# Patient Record
Sex: Male | Born: 1975 | Race: White | Hispanic: No | Marital: Married | Smoking: Never smoker
Health system: Southern US, Community
[De-identification: ages and names within clinical notes are randomized; demographics above are authoritative.]

## PROBLEM LIST (undated history)

## (undated) DIAGNOSIS — Z8669 Personal history of other diseases of the nervous system and sense organs: Secondary | ICD-10-CM

## (undated) HISTORY — DX: Personal history of other diseases of the nervous system and sense organs: Z86.69

## (undated) HISTORY — PX: OTHER SURGICAL HISTORY: SHX169

## (undated) HISTORY — PX: BACK SURGERY: SHX140

## (undated) HISTORY — PX: TYMPANOSTOMY TUBE PLACEMENT: SHX32

---

## 2014-07-04 ENCOUNTER — Encounter: Payer: Self-pay | Admitting: Family Medicine

## 2014-07-04 ENCOUNTER — Ambulatory Visit (INDEPENDENT_AMBULATORY_CARE_PROVIDER_SITE_OTHER): Payer: Federal, State, Local not specified - PPO | Admitting: Family Medicine

## 2014-07-04 VITALS — BP 131/85 | HR 62 | Temp 98.3°F | Ht 67.0 in | Wt 162.0 lb

## 2014-07-04 DIAGNOSIS — Z8669 Personal history of other diseases of the nervous system and sense organs: Secondary | ICD-10-CM

## 2014-07-04 DIAGNOSIS — M7661 Achilles tendinitis, right leg: Secondary | ICD-10-CM

## 2014-07-04 DIAGNOSIS — Z0000 Encounter for general adult medical examination without abnormal findings: Secondary | ICD-10-CM | POA: Diagnosis not present

## 2014-07-04 DIAGNOSIS — Z8042 Family history of malignant neoplasm of prostate: Secondary | ICD-10-CM

## 2014-07-04 DIAGNOSIS — Z8619 Personal history of other infectious and parasitic diseases: Secondary | ICD-10-CM

## 2014-07-04 DIAGNOSIS — M722 Plantar fascial fibromatosis: Secondary | ICD-10-CM | POA: Insufficient documentation

## 2014-07-04 DIAGNOSIS — Z9622 Myringotomy tube(s) status: Secondary | ICD-10-CM | POA: Insufficient documentation

## 2014-07-04 HISTORY — DX: Personal history of other diseases of the nervous system and sense organs: Z86.69

## 2014-07-04 NOTE — Progress Notes (Signed)
CC: Matthew Marks is a 39 y.o. male is here for Establish Care   Subjective: HPI:  Colonoscopy: No current indication begin screening at age 39 Prostate: Discussed screening risks/beneifts with patient today, father had prostate cancer at age 41 we'll begin screening at age 39  Influenza Vaccine: No current indication Pneumovax: No current indication Td/Tdap: Up-to-date Zoster: (Start 41 yo)  Very present 39 year old here to establish care  His only complaint is a long-standing right heel pain that has been present for at least a year now. Interventions have included x-rays, physical therapy, chiropractic sessions in a topical anti-inflammatory. Despite all this pain is persistent, moderate in severity nonradiating localized on the back of the heel and painful enough to where he has pretty much stopped playing soccer and running which he used to do on a daily basis.   Review of Systems - General ROS: negative for - chills, fever, night sweats, weight gain or weight loss Ophthalmic ROS: negative for - decreased vision Psychological ROS: negative for - anxiety or depression ENT ROS: negative for - hearing change, nasal congestion, tinnitus or allergies Hematological and Lymphatic ROS: negative for - bleeding problems, bruising or swollen lymph nodes Breast ROS: negative Respiratory ROS: no cough, shortness of breath, or wheezing Cardiovascular ROS: no chest pain or dyspnea on exertion Gastrointestinal ROS: no abdominal pain, change in bowel habits, or black or bloody stools Genito-Urinary ROS: negative for - genital discharge, genital ulcers, incontinence or abnormal bleeding from genitals Musculoskeletal ROS: negative for - joint pain or muscle pain other than that described above Neurological ROS: negative for - headaches or memory loss Dermatological ROS: negative for lumps, mole changes, rash and skin lesion changes  History reviewed. No pertinent past medical history.  Past  Surgical History  Procedure Laterality Date  . Tympanostomy tube placement      as a child and as an adult  . Left knee surgery ligament tension     Family History  Problem Relation Age of Onset  . Prostate cancer Father   . Diabetes Mother   . Hypertension Father     History   Social History  . Marital Status: Married    Spouse Name: N/A  . Number of Children: N/A  . Years of Education: N/A   Occupational History  . Not on file.   Social History Main Topics  . Smoking status: Current Every Day Smoker    Types: Cigars  . Smokeless tobacco: Not on file  . Alcohol Use: 7.2 oz/week    12 Standard drinks or equivalent per week  . Drug Use: No  . Sexual Activity:    Partners: Female   Other Topics Concern  . Not on file   Social History Narrative  . No narrative on file     Objective: BP 131/85 mmHg  Pulse 62  Temp(Src) 98.3 F (36.8 C) (Oral)  Ht 5\' 7"  (1.702 m)  Wt 162 lb (73.483 kg)  BMI 25.37 kg/m2  General: No Acute Distress HEENT: Atraumatic, normocephalic, conjunctivae normal without scleral icterus.  No nasal discharge, hearing grossly intact, TMs with good landmarks bilaterally with no middle ear abnormalities, tympanostomy tube in the right ear, posterior pharynx clear without oral lesions. Neck: Supple, trachea midline, no cervical nor supraclavicular adenopathy. Pulmonary: Clear to auscultation bilaterally without wheezing, rhonchi, nor rales. Cardiac: Regular rate and rhythm.  No murmurs, rubs, nor gallops. No peripheral edema.  2+ peripheral pulses bilaterally. Abdomen: Bowel sounds normal.  No masses.  Non-tender without  rebound.  Negative Murphy's sign. MSK: Grossly intact, no signs of weakness.  Full strength throughout upper and lower extremities.  Full ROM in upper and lower extremities.  No midline spinal tenderness. Neuro: Gait unremarkable, CN II-XII grossly intact.  C5-C6 Reflex 2/4 Bilaterally, L4 Reflex 2/4 Bilaterally.  Cerebellar function  intact. Skin: No rashes. Noninflamed seborrheic keratosis 2 on the back, to noninflamed less than half centimeter sebaceous cyst on the right upper shoulder Psych: Alert and oriented to person/place/time.  Thought process normal. No anxiety/depression.  Assessment & Plan: [name] was seen today for establish care.  Diagnoses and all orders for this visit:  Annual physical exam Orders: -     Lipid panel -     COMPLETE METABOLIC PANEL WITH GFR -     CBC  H/O chronic ear infection  Family history of prostate cancer  Right Achilles tendinitis   Healthy lifestyle interventions including but not limited to regular exercise, a healthy low fat diet, moderation of salt intake, the dangers of tobacco/alcohol/recreational drug use, nutrition supplementation, and accident avoidance were discussed with the patient and a handout was provided for future reference.  Offered excision of sebaceous cyst that his convenience but that these are benign and do not need to have any intervention done on them unless they're painful or infected.  I referred him to Dr. Karie Schwalbe in our sports medicine clinic for further management of his Achilles tendinitis.  At the end of our visit he tells me that his right ear feels somewhat full lately, encouraged him to use leftover ofloxacin and like ear drops and follow-up with his ear nose and throat physician if he does not have enough drops for 7 days at a frequency of twice a day call me and I will call in a prescription  Return in about 1 year (around 07/04/2015).

## 2014-07-04 NOTE — Patient Instructions (Signed)
Dr. Daphanie Oquendo's General Advice Following Your Complete Physical Exam  The Benefits of Regular Exercise: Unless you suffer from an uncontrolled cardiovascular condition, studies strongly suggest that regular exercise and physical activity will add to both the quality and length of your life.  The World Health Organization recommends 150 minutes of moderate intensity aerobic activity every week.  This is best split over 3-4 days a week, and can be as simple as a brisk walk for just over 35 minutes "most days of the week".  This type of exercise has been shown to lower LDL-Cholesterol, lower average blood sugars, lower blood pressure, lower cardiovascular disease risk, improve memory, and increase one's overall sense of wellbeing.  The addition of anaerobic (or "strength training") exercises offers additional benefits including but not limited to increased metabolism, prevention of osteoporosis, and improved overall cholesterol levels.  How Can I Strive For A Low-Fat Diet?: Current guidelines recommend that 25-35 percent of your daily energy (food) intake should come from fats.  One might ask how can this be achieved without having to dissect each meal on a daily basis?  Switch to skim or 1% milk instead of whole milk.  Focus on lean meats such as ground turkey, fresh fish, baked chicken, and lean cuts of beef as your source of dietary protein.  Limit saturated fat consumption to less than 10% of your daily caloric intake.  Limit trans fatty acid consumption primarily by limiting synthetic trans fats such as partially hydrogenated oils (Ex: fried fast foods).  Substitute olive or vegetable oil for solid fats where possible.  Moderation of Salt Intake: Provided you don't carry a diagnosis of congestive heart failure nor renal failure, I recommend a daily allowance of no more than 2300 mg of salt (sodium).  Keeping under this daily goal is associated with a decreased risk of cardiovascular events, creeping  above it can lead to elevated blood pressures and increases your risk of cardiovascular events.  Milligrams (mg) of salt is listed on all nutrition labels, and your daily intake can add up faster than you think.  Most canned and frozen dinners can pack in over half your daily salt allowance in one meal.    Lifestyle Health Risks: Certain lifestyle choices carry specific health risks.  As you may already know, tobacco use has been associated with increasing one's risk of cardiovascular disease, pulmonary disease, numerous cancers, among many other issues.  What you may not know is that there are medications and nicotine replacement strategies that can more than double your chances of successfully quitting.  I would be thrilled to help manage your quitting strategy if you currently use tobacco products.  When it comes to alcohol use, I've yet to find an "ideal" daily allowance.  Provided an individual does not have a medical condition that is exacerbated by alcohol consumption, general guidelines determine "safe drinking" as no more than two standard drinks for a man or no more than one standard drink for a male per day.  However, much debate still exists on whether any amount of alcohol consumption is technically "safe".  My general advice, keep alcohol consumption to a minimum for general health promotion.  If you or others believe that alcohol, tobacco, or recreational drug use is interfering with your life, I would be happy to provide confidential counseling regarding treatment options.  General "Over The Counter" Nutrition Advice: Postmenopausal women should aim for a daily calcium intake of 1200 mg, however a significant portion of this might already be   provided by diets including milk, yogurt, cheese, and other dairy products.  Vitamin D has been shown to help preserve bone density, prevent fatigue, and has even been shown to help reduce falls in the elderly.  Ensuring a daily intake of 800 Units of  Vitamin D is a good place to start to enjoy the above benefits, we can easily check your Vitamin D level to see if you'd potentially benefit from supplementation beyond 800 Units a day.  Folic Acid intake should be of particular concern to women of childbearing age.  Daily consumption of 400-800 mcg of Folic Acid is recommended to minimize the chance of spinal cord defects in a fetus should pregnancy occur.    For many adults, accidents still remain one of the most common culprits when it comes to cause of death.  Some of the simplest but most effective preventitive habits you can adopt include regular seatbelt use, proper helmet use, securing firearms, and regularly testing your smoke and carbon monoxide detectors.  Christianjames Soule B. Terez Montee DO [facility] [address], [address] [place], [place] [address] Phone: [phone]  

## 2014-07-10 ENCOUNTER — Encounter: Payer: Self-pay | Admitting: Family Medicine

## 2014-07-10 DIAGNOSIS — S33100A Subluxation of unspecified lumbar vertebra, initial encounter: Secondary | ICD-10-CM | POA: Insufficient documentation

## 2014-07-10 DIAGNOSIS — M5136 Other intervertebral disc degeneration, lumbar region: Secondary | ICD-10-CM | POA: Insufficient documentation

## 2014-07-10 LAB — CBC
HCT: 46.3 % (ref 39.0–52.0)
Hemoglobin: 15.8 g/dL (ref 13.0–17.0)
MCH: 30.5 pg (ref 26.0–34.0)
MCHC: 34.1 g/dL (ref 30.0–36.0)
MCV: 89.4 fL (ref 78.0–100.0)
MPV: 9.6 fL (ref 8.6–12.4)
Platelets: 271 10*3/uL (ref 150–400)
RBC: 5.18 MIL/uL (ref 4.22–5.81)
RDW: 13 % (ref 11.5–15.5)
WBC: 5 10*3/uL (ref 4.0–10.5)

## 2014-07-10 LAB — COMPLETE METABOLIC PANEL WITH GFR
ALT: 22 U/L (ref 0–53)
AST: 20 U/L (ref 0–37)
Albumin: 4.6 g/dL (ref 3.5–5.2)
Alkaline Phosphatase: 45 U/L (ref 39–117)
BILIRUBIN TOTAL: 0.7 mg/dL (ref 0.2–1.2)
BUN: 18 mg/dL (ref 6–23)
CALCIUM: 9.3 mg/dL (ref 8.4–10.5)
CHLORIDE: 102 meq/L (ref 96–112)
CO2: 27 meq/L (ref 19–32)
CREATININE: 1.01 mg/dL (ref 0.50–1.35)
GLUCOSE: 96 mg/dL (ref 70–99)
Potassium: 4.7 mEq/L (ref 3.5–5.3)
Sodium: 138 mEq/L (ref 135–145)
Total Protein: 7.4 g/dL (ref 6.0–8.3)

## 2014-07-10 LAB — LIPID PANEL
CHOLESTEROL: 183 mg/dL (ref 0–200)
HDL: 57 mg/dL (ref >=40)
LDL CALC: 112 mg/dL — AB (ref 0–99)
TRIGLYCERIDES: 68 mg/dL (ref <150)
Total CHOL/HDL Ratio: 3.2 Ratio
VLDL: 14 mg/dL (ref 0–40)

## 2014-07-24 ENCOUNTER — Ambulatory Visit (INDEPENDENT_AMBULATORY_CARE_PROVIDER_SITE_OTHER): Payer: Federal, State, Local not specified - PPO | Admitting: Sports Medicine

## 2014-07-24 ENCOUNTER — Encounter: Payer: Self-pay | Admitting: Sports Medicine

## 2014-07-24 DIAGNOSIS — M722 Plantar fascial fibromatosis: Secondary | ICD-10-CM

## 2014-07-24 MED ORDER — MELOXICAM 15 MG PO TABS: ORAL_TABLET | ORAL | Status: DC

## 2014-07-24 NOTE — Progress Notes (Signed)
   Subjective:    I'm seeing this patient as a consultation for:  Dr. Laren BoomSean Hommel  CC: Bilateral heel pain  HPI: This is a very pleasant 39 year old male, he plays soccer, coaches, for years he's had pain that he localizes on the plantar aspect of both heels, moderate, persistent, worse with the first few steps in the morning. He is barefoot through the evening at his house.  Past medical history, Surgical history, Family history not pertinant except as noted below, Social history, Allergies, and medications have been entered into the medical record, reviewed, and no changes needed.   Review of Systems: No headache, visual changes, nausea, vomiting, diarrhea, constipation, dizziness, abdominal pain, skin rash, fevers, chills, night sweats, weight loss, swollen lymph nodes, body aches, joint swelling, muscle aches, chest pain, shortness of breath, mood changes, visual or auditory hallucinations.   Objective:   General: Well Developed, well nourished, and in no acute distress.  Neuro/Psych: Alert and oriented x3, extra-ocular muscles intact, able to move all 4 extremities, sensation grossly intact. Skin: Warm and dry, no rashes noted.  Respiratory: Not using accessory muscles, speaking in full sentences, trachea midline.  Cardiovascular: Pulses palpable, no extremity edema. Abdomen: Does not appear distended. Bilateral feet: No visible erythema or swelling. Range of motion is full in all directions. Strength is 5/5 in all directions. No hallux valgus. Pes cavus. No abnormal callus noted. No pain over the navicular prominence, or base of fifth metatarsal. Minimal tenderness to palpation of the calcaneal insertion of plantar fascia. No pain at the Achilles insertion. No pain over the calcaneal bursa. No pain of the retrocalcaneal bursa. No tenderness to palpation over the tarsals, metatarsals, or phalanges. No hallux rigidus or limitus. No tenderness palpation over interphalangeal  joints. No pain with compression of the metatarsal heads. Neurovascularly intact distally.  Impression and Recommendations:   This case required medical decision making of moderate complexity.

## 2014-07-24 NOTE — Assessment & Plan Note (Signed)
With pes cavus. Gel cups, return for custom orthotics, home rehabilitation exercises and meloxicam. Advised against walking barefoot at home.

## 2014-08-08 ENCOUNTER — Ambulatory Visit (INDEPENDENT_AMBULATORY_CARE_PROVIDER_SITE_OTHER): Payer: Federal, State, Local not specified - PPO | Admitting: Sports Medicine

## 2014-08-08 ENCOUNTER — Encounter: Payer: Self-pay | Admitting: Sports Medicine

## 2014-08-08 VITALS — BP 126/79 | HR 65 | Ht 67.0 in | Wt 163.0 lb

## 2014-08-08 DIAGNOSIS — M722 Plantar fascial fibromatosis: Secondary | ICD-10-CM

## 2014-08-08 NOTE — Progress Notes (Signed)

## 2014-08-08 NOTE — Assessment & Plan Note (Signed)
Custom orthotics as above, continue rehabilitation exercises.  Return in one month.

## 2014-08-15 ENCOUNTER — Encounter: Payer: Self-pay | Admitting: Family Medicine

## 2014-08-15 ENCOUNTER — Ambulatory Visit (INDEPENDENT_AMBULATORY_CARE_PROVIDER_SITE_OTHER): Payer: Federal, State, Local not specified - PPO | Admitting: Family Medicine

## 2014-08-15 VITALS — BP 119/77 | HR 69 | Wt 162.0 lb

## 2014-08-15 DIAGNOSIS — R1310 Dysphagia, unspecified: Secondary | ICD-10-CM

## 2014-08-15 NOTE — Progress Notes (Signed)
CC: Matthew Marks is a 39 y.o. male is here for trouble swallowing   Subjective: HPI:  Irritation at the front and the bottom of his throat with swallowing for the past. Symptoms are mild in severity but after throwing up a distasteful hamburger last week symptoms have been just above mild in severity and more noticeable. Occurs with every episode of swallowing. Nothing else particularly makes it better or worse. He denies any abdominal pain, gastric reflux, neck pain, intentional weight loss, no swelling of the neck. Denies any mouth pain. No interventions as of yet. Denies swollen lymph nodes. Rare tobacco use in the past and currently no tobacco use. No heavy alcohol use. Occasional change of voice but this is only if he is talking a lot and lasts only for a few seconds.  Review Of Systems Outlined In HPI  No past medical history on file.  Past Surgical History  Procedure Laterality Date  . Tympanostomy tube placement      as a child and as an adult  . Left knee surgery ligament tension     Family History  Problem Relation Age of Onset  . Prostate cancer Father   . Diabetes Mother   . Hypertension Father     History   Social History  . Marital Status: Married    Spouse Name: N/A  . Number of Children: N/A  . Years of Education: N/A   Occupational History  . Not on file.   Social History Main Topics  . Smoking status: Never Smoker   . Smokeless tobacco: Not on file  . Alcohol Use: 7.2 oz/week    12 Standard drinks or equivalent per week  . Drug Use: No  . Sexual Activity:    Partners: Female   Other Topics Concern  . Not on file   Social History Narrative     Objective: BP 119/77 mmHg  Pulse 69  Wt 162 lb (73.483 kg)  General: Alert and Oriented, No Acute Distress HEENT: Pupils equal, round, reactive to light. Conjunctivae clear.  External ears unremarkable, canals clear with intact TMs with appropriate landmarks.  Middle ear appears open without effusion. Pink  inferior turbinates.  Moist mucous membranes, pharynx without inflammation nor lesions.  Neck supple without palpable lymphadenopathy nor abnormal masses. Lungs: clear and comfortable work of breathing Cardiac: Regular rate and rhythm.  Assessment & Plan: Matthew Marks was seen today for trouble swallowing.  Diagnoses and all orders for this visit:  Dysphagia Orders: -     Ambulatory referral to ENT   Discussed with patient I don't have a clear reason for his discomfort since that'll have full visualization below the pharynx. I'd like him to go see ear nose and throat for consideration of better visualization of his throat.  Return if symptoms worsen or fail to improve.

## 2014-09-04 ENCOUNTER — Encounter: Payer: Self-pay | Admitting: Family Medicine

## 2014-09-04 DIAGNOSIS — R198 Other specified symptoms and signs involving the digestive system and abdomen: Secondary | ICD-10-CM | POA: Insufficient documentation

## 2014-09-04 DIAGNOSIS — R0989 Other specified symptoms and signs involving the circulatory and respiratory systems: Secondary | ICD-10-CM | POA: Insufficient documentation

## 2014-09-05 ENCOUNTER — Ambulatory Visit (INDEPENDENT_AMBULATORY_CARE_PROVIDER_SITE_OTHER): Payer: Federal, State, Local not specified - PPO | Admitting: Sports Medicine

## 2014-09-05 ENCOUNTER — Encounter: Payer: Self-pay | Admitting: Sports Medicine

## 2014-09-05 VITALS — BP 112/74 | HR 72 | Ht 67.0 in | Wt 162.0 lb

## 2014-09-05 DIAGNOSIS — M722 Plantar fascial fibromatosis: Secondary | ICD-10-CM

## 2014-09-05 DIAGNOSIS — M7751 Other enthesopathy of right foot: Secondary | ICD-10-CM | POA: Diagnosis not present

## 2014-09-05 DIAGNOSIS — M778 Other enthesopathies, not elsewhere classified: Secondary | ICD-10-CM | POA: Insufficient documentation

## 2014-09-05 DIAGNOSIS — M779 Enthesopathy, unspecified: Secondary | ICD-10-CM

## 2014-09-05 NOTE — Assessment & Plan Note (Signed)
Heel lift, continue meloxicam. Rehabilitation exercises given. MRI and then retrocalcaneal bursa injection of no better.

## 2014-09-05 NOTE — Assessment & Plan Note (Signed)
Resolved with rehabilitation exercises and custom orthotics

## 2014-09-05 NOTE — Progress Notes (Signed)
  Subjective:    CC: Follow-up  HPI: Bilateral plantar fasciitis: Resolved with orthotics and rehabilitation exercises as well as meloxicam.  Right heel pain: Localize just deep to the insertion of the Achilles, worse with weightbearing. Moderate, persistent without radiation. No trauma.  Past medical history, Surgical history, Family history not pertinant except as noted below, Social history, Allergies, and medications have been entered into the medical record, reviewed, and no changes needed.   Review of Systems: No fevers, chills, night sweats, weight loss, chest pain, or shortness of breath.   Objective:    General: Well Developed, well nourished, and in no acute distress.  Neuro: Alert and oriented x3, extra-ocular muscles intact, sensation grossly intact.  HEENT: Normocephalic, atraumatic, pupils equal round reactive to light, neck supple, no masses, no lymphadenopathy, thyroid nonpalpable.  Skin: Warm and dry, no rashes. Cardiac: Regular rate and rhythm, no murmurs rubs or gallops, no lower extremity edema.  Respiratory: Clear to auscultation bilaterally. Not using accessory muscles, speaking in full sentences. Right Ankle: No visible erythema or swelling. Range of motion is full in all directions. Strength is 5/5 in all directions. Stable lateral and medial ligaments; squeeze test and kleiger test unremarkable; Talar dome nontender; No pain at base of 5th MT; No tenderness over cuboid; No tenderness over N spot or navicular prominence No tenderness on posterior aspects of lateral and medial malleolus No sign of peroneal tendon subluxations; Negative tarsal tunnel tinel's Tender to palpation just deep to the Achilles insertion over the retrocalcaneal bursa  Heel lifts placed in both orthotics.  Impression and Recommendations:

## 2014-09-17 ENCOUNTER — Ambulatory Visit (INDEPENDENT_AMBULATORY_CARE_PROVIDER_SITE_OTHER): Payer: Federal, State, Local not specified - PPO | Admitting: Family Medicine

## 2014-09-17 ENCOUNTER — Encounter: Payer: Self-pay | Admitting: Family Medicine

## 2014-09-17 VITALS — BP 115/71 | HR 63 | Temp 97.7°F | Wt 167.0 lb

## 2014-09-17 DIAGNOSIS — H6690 Otitis media, unspecified, unspecified ear: Secondary | ICD-10-CM | POA: Diagnosis not present

## 2014-09-17 MED ORDER — CEFDINIR 300 MG PO CAPS: 300.0000 mg | ORAL_CAPSULE | Freq: Two times a day (BID) | ORAL | Status: AC

## 2014-09-17 NOTE — Progress Notes (Signed)
CC: Matthew Marks is a 39 y.o. male is here for right ear feels inflammed   Subjective: HPI:  Complains of right ear fullness with muffled sounds and A yellow discharge that has been present ever since a few days after he went swimming without ear plugs on July 4. Symptoms have not improved since using otofloxacin twice a day. He denies fevers, chills, headache, nor any other motor or sensory disturbances other than that described above. The symptoms are persistent throughout the day and mild in severity. He's had this happen in the past when he went swimming without earplugs ever since he had a tympanostomy tube placed in his right ear    Review Of Systems Outlined In HPI  No past medical history on file.  Past Surgical History  Procedure Laterality Date  . Tympanostomy tube placement      as a child and as an adult  . Left knee surgery ligament tension     Family History  Problem Relation Age of Onset  . Prostate cancer Father   . Diabetes Mother   . Hypertension Father     History   Social History  . Marital Status: Married    Spouse Name: N/A  . Number of Children: N/A  . Years of Education: N/A   Occupational History  . Not on file.   Social History Main Topics  . Smoking status: Never Smoker   . Smokeless tobacco: Not on file  . Alcohol Use: 7.2 oz/week    12 Standard drinks or equivalent per week  . Drug Use: No  . Sexual Activity:    Partners: Female   Other Topics Concern  . Not on file   Social History Narrative     Objective: BP 115/71 mmHg  Pulse 63  Temp(Src) 97.7 F (36.5 C) (Oral)  Wt 167 lb (75.751 kg)  General: Alert and Oriented, No Acute Distress HEENT: Pupils equal, round, reactive to light. Conjunctivae clear.  External ears unremarkable, canals clear with intact TMs with appropriate landmarks other than on the right with some mild scar tissue on the tympanic membrane.. Left Middle ear appears open without effusion, right middle ear has a  purulent effusion, blue tympanostomy tube in the right ear appears open.. Pink inferior turbinates.  Moist mucous membranes, pharynx without inflammation nor lesions.  Neck supple without palpable lymphadenopathy nor abnormal masses. Extremities: No peripheral edema.  Strong peripheral pulses.  Mental Status: No depression, anxiety, nor agitation. Skin: Warm and dry.  Assessment & Plan: Matthew Marks was seen today for right ear feels inflammed.  Diagnoses and all orders for this visit:  Otitis media, recurrence not specified, unspecified chronicity, unspecified laterality, unspecified otitis media type Orders: -     cefdinir (OMNICEF) 300 MG capsule; Take 1 capsule (300 mg total) by mouth 2 (two) times daily.   Right otitis media not responding to antibiotic eardrops therefore start Omnicef, I've asked him to call me on Monday if he still having persistent symptoms, for now continue antibiotic eardrops  Return if symptoms worsen or fail to improve.

## 2014-09-29 ENCOUNTER — Ambulatory Visit: Payer: Federal, State, Local not specified - PPO | Admitting: Sports Medicine

## 2014-10-02 ENCOUNTER — Ambulatory Visit (INDEPENDENT_AMBULATORY_CARE_PROVIDER_SITE_OTHER): Payer: Federal, State, Local not specified - PPO | Admitting: Sports Medicine

## 2014-10-02 ENCOUNTER — Encounter: Payer: Self-pay | Admitting: Sports Medicine

## 2014-10-02 ENCOUNTER — Ambulatory Visit (INDEPENDENT_AMBULATORY_CARE_PROVIDER_SITE_OTHER): Payer: Federal, State, Local not specified - PPO

## 2014-10-02 DIAGNOSIS — M7751 Other enthesopathy of right foot: Secondary | ICD-10-CM

## 2014-10-02 MED ORDER — NAPROXEN-ESOMEPRAZOLE 500-20 MG PO TBEC: DELAYED_RELEASE_TABLET | ORAL | Status: DC

## 2014-10-02 NOTE — Assessment & Plan Note (Signed)
Symptoms did improve significantly with custom orthotics, heel lift, meloxicam however he did get some gastritis, and has since stopped. Improvement has plateaued, at this point he has become a candidate for interventional treatment, we are going to confirm the diagnosis first with a. MRI of the right foot, I am also switching to Vimovo to protect his stomach. Return to go over MRI results

## 2014-10-02 NOTE — Progress Notes (Signed)
  Subjective:    CC: Follow-up  HPI: Retrocalcaneal bursitis, right: Improved significant only with custom orthotics, heel lift, rehabilitation exercises, meloxicam however did develop some epigastric pain on meloxicam and has since stopped this. He tells me that his improvement is also since plateaued. Pain is mild, persistent at this point.  Past medical history, Surgical history, Family history not pertinant except as noted below, Social history, Allergies, and medications have been entered into the medical record, reviewed, and no changes needed.   Review of Systems: No fevers, chills, night sweats, weight loss, chest pain, or shortness of breath.   Objective:    General: Well Developed, well nourished, and in no acute distress.  Neuro: Alert and oriented x3, extra-ocular muscles intact, sensation grossly intact.  HEENT: Normocephalic, atraumatic, pupils equal round reactive to light, neck supple, no masses, no lymphadenopathy, thyroid nonpalpable.  Skin: Warm and dry, no rashes. Cardiac: Regular rate and rhythm, no murmurs rubs or gallops, no lower extremity edema.  Respiratory: Clear to auscultation bilaterally. Not using accessory muscles, speaking in full sentences. Right Ankle: No visible erythema or swelling. Range of motion is full in all directions. Strength is 5/5 in all directions. Stable lateral and medial ligaments; squeeze test and kleiger test unremarkable; Talar dome nontender; No pain at base of 5th MT; No tenderness over cuboid; No tenderness over N spot or navicular prominence No tenderness on posterior aspects of lateral and medial malleolus No sign of peroneal tendon subluxations; Negative tarsal tunnel tinel's Essentially nontender at the retrocalcaneal bursa today.  Impression and Recommendations:    I spent 25 minutes with this patient, greater than 50% was face-to-face time counseling regarding the above diagnoses

## 2014-10-06 ENCOUNTER — Ambulatory Visit (INDEPENDENT_AMBULATORY_CARE_PROVIDER_SITE_OTHER): Payer: Federal, State, Local not specified - PPO

## 2014-10-06 DIAGNOSIS — M7751 Other enthesopathy of right foot: Secondary | ICD-10-CM

## 2014-10-06 DIAGNOSIS — M25571 Pain in right ankle and joints of right foot: Secondary | ICD-10-CM

## 2014-10-20 ENCOUNTER — Ambulatory Visit (INDEPENDENT_AMBULATORY_CARE_PROVIDER_SITE_OTHER): Payer: Federal, State, Local not specified - PPO | Admitting: Sports Medicine

## 2014-10-20 ENCOUNTER — Encounter: Payer: Self-pay | Admitting: Sports Medicine

## 2014-10-20 VITALS — BP 122/75 | HR 55 | Ht 67.0 in | Wt 165.0 lb

## 2014-10-20 DIAGNOSIS — M7751 Other enthesopathy of right foot: Secondary | ICD-10-CM

## 2014-10-20 DIAGNOSIS — M7750 Other enthesopathy of unspecified foot: Secondary | ICD-10-CM

## 2014-10-20 DIAGNOSIS — M6588 Other synovitis and tenosynovitis, other site: Secondary | ICD-10-CM | POA: Diagnosis not present

## 2014-10-20 NOTE — Progress Notes (Signed)
  Subjective:    CC: Follow-up  HPI: Racer returns, for over 1 year he's had posterior foot and ankle pain, this was initially suspected to be retrocalcaneal bursitis, we placed him through orthotics and rehabilitation, NSAIDs, unfortunately she continued to have symptoms. We obtained an MRI in anticipation of performing an interventional procedure and he is here to review this. Symptoms continue to be moderate, persistent. Localized in the posterior foot just deep to the insertion of the Achilles, he also endorses some tightness in the posterior ankle joint deep in.  Past medical history, Surgical history, Family history not pertinant except as noted below, Social history, Allergies, and medications have been entered into the medical record, reviewed, and no changes needed.   Review of Systems: No fevers, chills, night sweats, weight loss, chest pain, or shortness of breath.   Objective:    General: Well Developed, well nourished, and in no acute distress.  Neuro: Alert and oriented x3, extra-ocular muscles intact, sensation grossly intact.  HEENT: Normocephalic, atraumatic, pupils equal round reactive to light, neck supple, no masses, no lymphadenopathy, thyroid nonpalpable.  Skin: Warm and dry, no rashes. Cardiac: Regular rate and rhythm, no murmurs rubs or gallops, no lower extremity edema.  Respiratory: Clear to auscultation bilaterally. Not using accessory muscles, speaking in full sentences.  MRI personally reviewed and shows a fairly severe flexor hallucis longus tendinitis just posterior to the talocrural joint, as well as a bit of fluid in the retrocalcaneal bursa.  Procedure: Real-time Ultrasound Guided Injection of retrocalcaneal bursa, right Device: GE Logiq E  Verbal informed consent obtained.  Time-out conducted.  Noted no overlying erythema, induration, or other signs of local infection.  Skin prepped in a sterile fashion.  Local anesthesia: Topical Ethyl chloride.  With  sterile technique and under real time ultrasound guidance:  Taking care to avoid intratendinous injection into the Achilles I guided the needle into the retrocalcaneal bursa and fat pad, 0.5 mL kenalog 40, 0.5 mL lidocaine was injected easily, and medication was seen tracking into the small amount of fluid in the retrocalcaneal bursa Completed without difficulty  Pain immediately resolved suggesting accurate placement of the medication.  Advised to call if fevers/chills, erythema, induration, drainage, or persistent bleeding.  Images permanently stored and available for review in the ultrasound unit.  Impression: Technically successful ultrasound guided injection.  Procedure: Real-time Ultrasound Guided Injection of right flexor hallucis longus tendon sheath Device: GE Logiq E  Verbal informed consent obtained.  Time-out conducted.  Noted no overlying erythema, induration, or other signs of local infection.  Skin prepped in a sterile fashion.  Local anesthesia: Topical Ethyl chloride.  With sterile technique and under real time ultrasound guidance:  Noted effusion with septation on ultrasound, visualized the neurovascular bundle, taking care to avoid neurovascular structures a 25-gauge needle was advanced into the tendon sheath, 1 mL kenalog 40, 1 mL lidocaine injected easily. Completed without difficulty  Pain immediately resolved suggesting accurate placement of the medication.  Advised to call if fevers/chills, erythema, induration, drainage, or persistent bleeding.  Images permanently stored and available for review in the ultrasound unit.  Impression: Technically successful ultrasound guided injection.  The ankle was strapped with compressive dressing  Impression and Recommendations:

## 2014-10-20 NOTE — Assessment & Plan Note (Addendum)
MRI did show flexor hallucis longus tendinitis of the posterior ankle joint as well as a small amount of fluid in the retrocalcaneal bursa. Injections were placed both into the retrocalcaneal bursa as well as the flexor hallucis longus tendon sheath. He will take it easy for the next week and return to see me in one month. Ankle was strapped with compressive dressing.

## 2014-11-17 ENCOUNTER — Ambulatory Visit (INDEPENDENT_AMBULATORY_CARE_PROVIDER_SITE_OTHER): Payer: Federal, State, Local not specified - PPO | Admitting: Family Medicine

## 2014-11-17 ENCOUNTER — Encounter: Payer: Self-pay | Admitting: Family Medicine

## 2014-11-17 ENCOUNTER — Ambulatory Visit: Payer: Federal, State, Local not specified - PPO | Admitting: Sports Medicine

## 2014-11-17 VITALS — BP 131/70 | HR 57 | Temp 97.7°F | Wt 165.0 lb

## 2014-11-17 DIAGNOSIS — H6691 Otitis media, unspecified, right ear: Secondary | ICD-10-CM | POA: Diagnosis not present

## 2014-11-17 MED ORDER — AMOXICILLIN-POT CLAVULANATE 500-125 MG PO TABS: ORAL_TABLET | ORAL | Status: AC

## 2014-11-17 NOTE — Progress Notes (Signed)
CC: Matthew Marks is a 39 y.o. male is here for ear infection?   Subjective: HPI:  Right ear fullness with decreased hearing and pus draining from it for the past few days. He's tried Cortisporin what sounds to be Cipro eardrops but has had no benefit. Symptoms have been absent ever since he took [place] when I saw him back in July of until recently.denies fevers, chills, nasal congestion, postnasal drip, sore throat, nor complete hearing loss in the right ear.  Review Of Systems Outlined In left ear pain, HPI  No past medical history on file.  Past Surgical History  Procedure Laterality Date  . Tympanostomy tube placement      as a child and as an adult  . Left knee surgery ligament tension     Family History  Problem Relation Age of Onset  . Prostate cancer Father   . Diabetes Mother   . Hypertension Father     Social History   Social History  . Marital Status: Married    Spouse Name: N/A  . Number of Children: N/A  . Years of Education: N/A   Occupational History  . Not on file.   Social History Main Topics  . Smoking status: Never Smoker   . Smokeless tobacco: Not on file  . Alcohol Use: 7.2 oz/week    12 Standard drinks or equivalent per week  . Drug Use: No  . Sexual Activity:    Partners: Female   Other Topics Concern  . Not on file   Social History Narrative     Objective: BP 131/70 mmHg  Pulse 57  Temp(Src) 97.7 F (36.5 C)  Wt 165 lb (74.844 kg)  General: Alert and Oriented, No Acute Distress HEENT: Pupils equal, round, reactive to light. Conjunctivae clear.  External ears unremarkable, canals clear with tympanic membrane on the left however the right tympanic membrane has a blue tympanostomy tube draining pus from the middle ear.  Pink inferior turbinates.  Moist mucous membranes, pharynx without inflammation nor lesions.  Neck supple without palpable lymphadenopathy nor abnormal masses Lungs: clear comfortable work of breathing  Extremities: No  peripheral edema.  Strong peripheral pulses.  Mental Status: No depression, anxiety, nor agitation. Skin: Warm and dry.  Assessment & Plan: Matthew Marks was seen today for ear infection?.  Diagnoses and all orders for this visit:  Recurrent acute otitis media of right ear, unspecified otitis media type -     amoxicillin-clavulanate (AUGMENTIN) 500-125 MG per tablet; Take one by mouth every 8 hours for ten total days.    start Augmentin and if no better by Wednesday please call me and I will send in a prescription to whatever pharmacy his near, he'll be traveling for the next week.Signs and symptoms requring emergent/urgent reevaluation were discussed with the patient.   Return if symptoms worsen or fail to improve.

## 2014-11-24 ENCOUNTER — Ambulatory Visit: Payer: Federal, State, Local not specified - PPO | Admitting: Sports Medicine

## 2014-11-27 ENCOUNTER — Ambulatory Visit (INDEPENDENT_AMBULATORY_CARE_PROVIDER_SITE_OTHER): Payer: Federal, State, Local not specified - PPO | Admitting: Sports Medicine

## 2014-11-27 ENCOUNTER — Encounter: Payer: Self-pay | Admitting: Sports Medicine

## 2014-11-27 VITALS — BP 129/85 | HR 66 | Ht 67.0 in | Wt 164.0 lb

## 2014-11-27 DIAGNOSIS — M7751 Other enthesopathy of right foot: Secondary | ICD-10-CM | POA: Diagnosis not present

## 2014-11-27 NOTE — Progress Notes (Signed)
  Subjective:    CC: follow-up  HPI: This is a pleasant 39 year old male, I saw him for a right ankle pain, MRI ended up showing a fairly severe flexor hallucis longus tendinitis as well as minimal retrocalcaneal bursitis, we injected both structures at the last visit and he returns today almost completely better. Still has a bit of tightness behind the ankle, but overall is happy with how things went. He continues to wear his heel lifts. Symptoms are mild, improving.  Past medical history, Surgical history, Family history not pertinant except as noted below, Social history, Allergies, and medications have been entered into the medical record, reviewed, and no changes needed.   Review of Systems: No fevers, chills, night sweats, weight loss, chest pain, or shortness of breath.   Objective:    General: Well Developed, well nourished, and in no acute distress.  Neuro: Alert and oriented x3, extra-ocular muscles intact, sensation grossly intact.  HEENT: Normocephalic, atraumatic, pupils equal round reactive to light, neck supple, no masses, no lymphadenopathy, thyroid nonpalpable.  Skin: Warm and dry, no rashes. Cardiac: Regular rate and rhythm, no murmurs rubs or gallops, no lower extremity edema.  Respiratory: Clear to auscultation bilaterally. Not using accessory muscles, speaking in full sentences. Right Ankle: Minimal visible fullness. Range of motion is full in all directions. Strength is 5/5 in all directions. Stable lateral and medial ligaments; squeeze test and kleiger test unremarkable; Talar dome nontender; No pain at base of 5th MT; No tenderness over cuboid; No tenderness over N spot or navicular prominence No tenderness on posterior aspects of lateral and medial malleolus No sign of peroneal tendon subluxations; Negative tarsal tunnel tinel's  Impression and Recommendations:

## 2014-11-27 NOTE — Assessment & Plan Note (Signed)
Improved significantly after retrocalcaneal bursa injection as well as flexor hallucis longus tendon sheath injection 1 month ago. Still has a bit of tightness. Discontinue heel lifts, no restrictions. Return to see me in 6 weeks, we will consider a repeat injection if no better.

## 2015-01-08 ENCOUNTER — Ambulatory Visit (INDEPENDENT_AMBULATORY_CARE_PROVIDER_SITE_OTHER): Payer: Federal, State, Local not specified - PPO | Admitting: Sports Medicine

## 2015-01-08 ENCOUNTER — Encounter: Payer: Self-pay | Admitting: Sports Medicine

## 2015-01-08 VITALS — BP 121/74 | HR 62 | Ht 67.0 in | Wt 164.0 lb

## 2015-01-08 DIAGNOSIS — M6588 Other synovitis and tenosynovitis, other site: Secondary | ICD-10-CM

## 2015-01-08 DIAGNOSIS — M778 Other enthesopathies, not elsewhere classified: Secondary | ICD-10-CM

## 2015-01-08 DIAGNOSIS — M779 Enthesopathy, unspecified: Principal | ICD-10-CM

## 2015-01-08 NOTE — Assessment & Plan Note (Signed)
Repeat right-sided FHL injection, the last one was 2 months ago, return in one.

## 2015-01-08 NOTE — Progress Notes (Signed)
  Subjective:    CC: recheck ankle pain  HPI: [name] returns, he had a right flexor hallucis longus tendinitis, MRI confirmed, there is a great deal of effusion in the tendon sheath behind the talocrural joint, we injected this under ultrasound guidance and he did extremely well, unfortunately he is having a mild recurrence, and is agreeable to try repeat interventional treatment.  Past medical history, Surgical history, Family history not pertinant except as noted below, Social history, Allergies, and medications have been entered into the medical record, reviewed, and no changes needed.   Review of Systems: No fevers, chills, night sweats, weight loss, chest pain, or shortness of breath.   Objective:    General: Well Developed, well nourished, and in no acute distress.  Neuro: Alert and oriented x3, extra-ocular muscles intact, sensation grossly intact.  HEENT: Normocephalic, atraumatic, pupils equal round reactive to light, neck supple, no masses, no lymphadenopathy, thyroid nonpalpable.  Skin: Warm and dry, no rashes. Cardiac: Regular rate and rhythm, no murmurs rubs or gallops, no lower extremity edema.  Respiratory: Clear to auscultation bilaterally. Not using accessory muscles, speaking in full sentences.  Procedure: Real-time Ultrasound Guided Injection of right flexor hallucis longus tendon sheath Device: GE Logiq E  Verbal informed consent obtained.  Time-out conducted.  Noted no overlying erythema, induration, or other signs of local infection.  Skin prepped in a sterile fashion.  Local anesthesia: Topical Ethyl chloride.  With sterile technique and under real time ultrasound guidance:  Needle advanced into the tendon sheath and 1 mL kenalog 40, 1 mL lidocaine injected easily taking care to avoid injection into the tendon itself Completed without difficulty  Pain immediately resolved suggesting accurate placement of the medication.  Advised to call if fevers/chills, erythema,  induration, drainage, or persistent bleeding.  Images permanently stored and available for review in the ultrasound unit.  Impression: Technically successful ultrasound guided injection.  Impression and Recommendations:

## 2015-02-09 ENCOUNTER — Encounter: Payer: Self-pay | Admitting: Sports Medicine

## 2015-02-09 ENCOUNTER — Ambulatory Visit (INDEPENDENT_AMBULATORY_CARE_PROVIDER_SITE_OTHER): Payer: Federal, State, Local not specified - PPO | Admitting: Sports Medicine

## 2015-02-09 VITALS — BP 133/85 | HR 61 | Wt 165.0 lb

## 2015-02-09 DIAGNOSIS — M778 Other enthesopathies, not elsewhere classified: Secondary | ICD-10-CM

## 2015-02-09 DIAGNOSIS — M6588 Other synovitis and tenosynovitis, other site: Secondary | ICD-10-CM | POA: Diagnosis not present

## 2015-02-09 DIAGNOSIS — M779 Enthesopathy, unspecified: Principal | ICD-10-CM

## 2015-02-09 NOTE — Assessment & Plan Note (Signed)
Has completely resolved after 2 ultrasound guided FHL tendon sheath injections. His dog ate a part of his orthotics we applied a couple of scaphoid pads to rebuild the defect.

## 2015-02-09 NOTE — Progress Notes (Signed)
  Subjective:    CC: Follow-up  HPI: Flexor hallucis longus tendinitis: Has done extremely well after 2 injections into a greatly inflamed flexor hallucis longus tendon sheath behind the talocrural joint. Unfortunately he still has a bit of pain under the long arch, and his dog ate part of his orthotic.  Past medical history, Surgical history, Family history not pertinant except as noted below, Social history, Allergies, and medications have been entered into the medical record, reviewed, and no changes needed.   Review of Systems: No fevers, chills, night sweats, weight loss, chest pain, or shortness of breath.   Objective:    General: Well Developed, well nourished, and in no acute distress.  Neuro: Alert and oriented x3, extra-ocular muscles intact, sensation grossly intact.  HEENT: Normocephalic, atraumatic, pupils equal round reactive to light, neck supple, no masses, no lymphadenopathy, thyroid nonpalpable.  Skin: Warm and dry, no rashes. Cardiac: Regular rate and rhythm, no murmurs rubs or gallops, no lower extremity edema.  Respiratory: Clear to auscultation bilaterally. Not using accessory muscles, speaking in full sentences. Right Foot: No visible erythema or swelling. Range of motion is full in all directions. Strength is 5/5 in all directions. No hallux valgus. No pes cavus or pes planus. No abnormal callus noted. No pain over the navicular prominence, or base of fifth metatarsal. No tenderness to palpation of the calcaneal insertion of plantar fascia. No pain at the Achilles insertion. No pain over the calcaneal bursa. No pain of the retrocalcaneal bursa. No tenderness to palpation over the tarsals, metatarsals, or phalanges. No hallux rigidus or limitus. No tenderness palpation over interphalangeal joints. No pain with compression of the metatarsal heads. Neurovascularly intact distally. Tender to palpation over the calcaneal navicular  Orthotics have been  partially consumed over the long arch, I added a couple of scaphoid pads.  Impression and Recommendations:

## 2015-03-12 ENCOUNTER — Encounter: Payer: Self-pay | Admitting: Family Medicine

## 2015-03-12 ENCOUNTER — Ambulatory Visit (INDEPENDENT_AMBULATORY_CARE_PROVIDER_SITE_OTHER): Payer: Federal, State, Local not specified - PPO | Admitting: Family Medicine

## 2015-03-12 VITALS — BP 141/91 | HR 74 | Wt 165.0 lb

## 2015-03-12 DIAGNOSIS — H6691 Otitis media, unspecified, right ear: Secondary | ICD-10-CM

## 2015-03-12 DIAGNOSIS — N529 Male erectile dysfunction, unspecified: Secondary | ICD-10-CM

## 2015-03-12 MED ORDER — LEVOFLOXACIN 500 MG PO TABS: 500.0000 mg | ORAL_TABLET | Freq: Every day | ORAL | Status: DC

## 2015-03-12 MED ORDER — SILDENAFIL CITRATE 20 MG PO TABS: ORAL_TABLET | ORAL | Status: DC

## 2015-03-12 NOTE — Progress Notes (Signed)
CC: Matthew Marks is a 40 y.o. male is here for right ear pain   Subjective: HPI: Drainage from the right ear that began 5 days ago slightly improved with leftover Augmentin but he ran out of this. He was originally painful however pain is dissipated. Drainage is now mild in severity throughout the day. No benefit from nasal steroid spray. He's had decreased hearing since the drainage began. He denies complete hearing loss. Has had some nasal congestion but no other complaints. Denies fevers, chills, headache, confusion or any left ear complaints  He also complains of mild difficulty both initiating and maintaining an erection. He denies any problems with libido. He is married to his wife and is still physically attractive to her. Symptoms are occurring during every sexual encounter. He was another something that can be used to help with this. He's not had any exertional chest pain and works out regularly.    Review Of Systems Outlined In HPI  No past medical history on file.  Past Surgical History  Procedure Laterality Date  . Tympanostomy tube placement      as a child and as an adult  . Left knee surgery ligament tension     Family History  Problem Relation Age of Onset  . Prostate cancer Father   . Diabetes Mother   . Hypertension Father     Social History   Social History  . Marital Status: Married    Spouse Name: N/A  . Number of Children: N/A  . Years of Education: N/A   Occupational History  . Not on file.   Social History Main Topics  . Smoking status: Never Smoker   . Smokeless tobacco: Not on file  . Alcohol Use: 7.2 oz/week    12 Standard drinks or equivalent per week  . Marks Use: No  . Sexual Activity:    Partners: Female   Other Topics Concern  . Not on file   Social History Narrative     Objective: BP 141/91 mmHg  Pulse 74  Wt 165 lb (74.844 kg)  General: Alert and Oriented, No Acute Distress HEENT: Pupils equal, round, reactive to light.  Conjunctivae clear.  External ears unremarkable, canals are clear, the right ear has a blue tympanostomy tube placed in the tympanic membrane. There is some serous discharge coming through the tube. Lungs: Clear comfortable work of breathing Cardiac: Regular rate and rhythm.  Extremities: No peripheral edema.  Strong peripheral pulses.  Mental Status: No depression, anxiety, nor agitation. Skin: Warm and dry.  Assessment & Plan: Matthew Marks was seen today for right ear pain.  Diagnoses and all orders for this visit:  Erectile dysfunction, unspecified erectile dysfunction type -     sildenafil (REVATIO) 20 MG tablet; 2-4 tabs by mouth only as needed for sex  Recurrent acute otitis media of right ear, unspecified otitis media type -     levofloxacin (LEVAQUIN) 500 MG tablet; Take 1 tablet (500 mg total) by mouth daily.   Erectile dysfunction: Discussed generic Viagra and that [facility] he can always try [place] in [place] Acute on chronic right otitis media: Begin Levaquin. Call if no better by Monday.  25 minutes spent face-to-face during visit today of which at least 50% was counseling or coordinating care regarding: 1. Erectile dysfunction, unspecified erectile dysfunction type   2. Recurrent acute otitis media of right ear, unspecified otitis media type      Return if symptoms worsen or fail to improve.

## 2015-06-11 ENCOUNTER — Ambulatory Visit (INDEPENDENT_AMBULATORY_CARE_PROVIDER_SITE_OTHER): Payer: Federal, State, Local not specified - PPO | Admitting: Family Medicine

## 2015-06-11 ENCOUNTER — Encounter: Payer: Self-pay | Admitting: Family Medicine

## 2015-06-11 VITALS — Wt 169.0 lb

## 2015-06-11 DIAGNOSIS — H65111 Acute and subacute allergic otitis media (mucoid) (sanguinous) (serous), right ear: Secondary | ICD-10-CM

## 2015-06-11 MED ORDER — DOXYCYCLINE HYCLATE 100 MG PO TABS: 100.0000 mg | ORAL_TABLET | Freq: Two times a day (BID) | ORAL | Status: DC

## 2015-06-11 NOTE — Progress Notes (Signed)
CC: Matthew Marks is a 40 y.o. male is here for No chief complaint on file.   Subjective: HPI:  Right-sided ear pain and discharge for the last 24 hours. Slowly getting worse. Feels like remote ear infections. He usually gets these 2-3 times a year. He denies any recent submersion of his head or trauma to the right ear. He denies fevers, chills, cough, shortness of breath but occasional sneezing since pollen counts have been up. Slight decreased hearing out of the right ear since the onset of discharge.   Review Of Systems Outlined In HPI  No past medical history on file.  Past Surgical History  Procedure Laterality Date  . Tympanostomy tube placement      as a child and as an adult  . Left knee surgery ligament tension     Family History  Problem Relation Age of Onset  . Prostate cancer Father   . Diabetes Mother   . Hypertension Father     Social History   Social History  . Marital Status: Married    Spouse Name: N/A  . Number of Children: N/A  . Years of Education: N/A   Occupational History  . Not on file.   Social History Main Topics  . Smoking status: Never Smoker   . Smokeless tobacco: Not on file  . Alcohol Use: 7.2 oz/week    12 Standard drinks or equivalent per week  . Drug Use: No  . Sexual Activity:    Partners: Female   Other Topics Concern  . Not on file   Social History Narrative     Objective: Wt 169 lb (76.658 kg)  General: Alert and Oriented, No Acute Distress HEENT: Pupils equal, round, reactive to light. Conjunctivae clear.  External ears unremarkable, canals clear with intact TMs with appropriate landmarks On the left however on the right there is a blue tympanostomy tube and moderate scar tissue on the membrane..  Middle ear appears open without effusion. Pink inferior turbinates.  Moist mucous membranes, pharynx without inflammation nor lesions.  Neck supple without palpable lymphadenopathy nor abnormal masses. Lungs: Clear and comfortable  work of breathing Cardiac: Regular rate and rhythm.  Extremities: No peripheral edema.  Strong peripheral pulses.  Mental Status: No depression, anxiety, nor agitation. Skin: Warm and dry.  Assessment & Plan: Diagnoses and all orders for this visit:  Acute mucoid otitis media of right ear -     doxycycline (VIBRA-TABS) 100 MG tablet; Take 1 tablet (100 mg total) by mouth 2 (two) times daily.   Acute otitis media: Start doxycycline, the tube seems to be functional and is allowing drainage. Call if no better on Monday and will arrange a new Rx to be sent wherever [place] going to [place] staying in [place].  Return if symptoms worsen or fail to improve.

## 2015-06-12 ENCOUNTER — Ambulatory Visit: Payer: Federal, State, Local not specified - PPO | Admitting: Family Medicine

## 2015-06-15 ENCOUNTER — Telehealth: Payer: Self-pay | Admitting: Family Medicine

## 2015-06-15 MED ORDER — LEVOFLOXACIN 500 MG PO TABS: 500.0000 mg | ORAL_TABLET | Freq: Every day | ORAL | Status: DC

## 2015-06-15 NOTE — Telephone Encounter (Signed)
Pt notified and stated that he'll with his location so Rx can be faxed

## 2015-06-15 NOTE — Telephone Encounter (Signed)
Matthew Marks, Rx placed in in-box ready for pickup/faxing. (printed since he's traveling)

## 2015-06-15 NOTE — Telephone Encounter (Signed)
Error

## 2015-06-15 NOTE — Telephone Encounter (Signed)
[facility] [place]

## 2015-06-15 NOTE — Telephone Encounter (Signed)
Patient states that he was told to call back if he was still having ear pain.  He is still having pain and mentioned you said that you would call in an antibotic today before he goes out of town.  [employer]~

## 2015-06-22 ENCOUNTER — Encounter: Payer: Self-pay | Admitting: Family Medicine

## 2015-06-22 ENCOUNTER — Ambulatory Visit (INDEPENDENT_AMBULATORY_CARE_PROVIDER_SITE_OTHER): Payer: Federal, State, Local not specified - PPO | Admitting: Family Medicine

## 2015-06-22 VITALS — BP 134/89 | HR 63 | Temp 98.0°F | Wt 165.0 lb

## 2015-06-22 DIAGNOSIS — H65111 Acute and subacute allergic otitis media (mucoid) (sanguinous) (serous), right ear: Secondary | ICD-10-CM

## 2015-06-22 MED ORDER — LEVOFLOXACIN 500 MG PO TABS: 500.0000 mg | ORAL_TABLET | Freq: Every day | ORAL | Status: DC

## 2015-06-22 NOTE — Progress Notes (Signed)
CC: Matthew Marks is a 40 y.o. male is here for Ear Pain and URI   Subjective: HPI:  Continues to have drainage from right ear since I saw him last he tells me that he's lost a little bit of hearing on that side as well. He is also additionally started to get a nonproductive cough and nasal congestion with postnasal drip. No benefit from pseudoephedrine or Mucinex. No other interventions as of yet. Denies fevers, chills, headache or confusion. Denies any other motor or sensory disturbances. He was never able to get the Levaquin prescribed last week due to some problems with a out-of-state pharmacy.   Review Of Systems Outlined In HPI  No past medical history on file.  Past Surgical History  Procedure Laterality Date  . Tympanostomy tube placement      as a child and as an adult  . Left knee surgery ligament tension     Family History  Problem Relation Age of Onset  . Prostate cancer Father   . Diabetes Mother   . Hypertension Father     Social History   Social History  . Marital Status: Married    Spouse Name: N/A  . Number of Children: N/A  . Years of Education: N/A   Occupational History  . Not on file.   Social History Main Topics  . Smoking status: Never Smoker   . Smokeless tobacco: Not on file  . Alcohol Use: 7.2 oz/week    12 Standard drinks or equivalent per week  . Drug Use: No  . Sexual Activity:    Partners: Female   Other Topics Concern  . Not on file   Social History Narrative     Objective: BP 134/89 mmHg  Pulse 63  Temp(Src) 98 F (36.7 C) (Oral)  Wt 165 lb (74.844 kg)  General: Alert and Oriented, No Acute Distress HEENT: Pupils equal, round, reactive to light. Conjunctivae clear.  External ears unremarkable, canals clear with intact TMs With a purulent effusion on the right side with a scant amount of pus coming through the tympanostomy tube.  Middle ear appears open without effusion. Pink inferior turbinates.  Moist mucous membranes, pharynx  without inflammation nor lesions.  Neck supple without palpable lymphadenopathy nor abnormal masses. Lungs: Clear to auscultation bilaterally, no wheezing/ronchi/rales.  Comfortable work of breathing. Good air movement. Extremities: No peripheral edema.  Strong peripheral pulses.  Mental Status: No depression, anxiety, nor agitation. Skin: Warm and dry.  Assessment & Plan: Matthew Marks was seen today for ear pain and uri.  Diagnoses and all orders for this visit:  Acute mucoid otitis media of right ear  Other orders -     levofloxacin (LEVAQUIN) 500 MG tablet; Take 1 tablet (500 mg total) by mouth daily.   Plan doesn't change from last week, start Levaquin call on Wednesday if not improving next step would be prednisone.   No Follow-up on file.

## 2015-07-13 DIAGNOSIS — K080 Exfoliation of teeth due to systemic causes: Secondary | ICD-10-CM | POA: Diagnosis not present

## 2015-07-14 DIAGNOSIS — D225 Melanocytic nevi of trunk: Secondary | ICD-10-CM | POA: Diagnosis not present

## 2015-07-14 DIAGNOSIS — L821 Other seborrheic keratosis: Secondary | ICD-10-CM | POA: Diagnosis not present

## 2015-07-16 ENCOUNTER — Ambulatory Visit (INDEPENDENT_AMBULATORY_CARE_PROVIDER_SITE_OTHER): Payer: Federal, State, Local not specified - PPO | Admitting: Family Medicine

## 2015-07-16 ENCOUNTER — Encounter: Payer: Self-pay | Admitting: Family Medicine

## 2015-07-16 VITALS — BP 124/85 | HR 66 | Wt 160.0 lb

## 2015-07-16 DIAGNOSIS — H6691 Otitis media, unspecified, right ear: Secondary | ICD-10-CM | POA: Diagnosis not present

## 2015-07-16 MED ORDER — CEFDINIR 300 MG PO CAPS: 300.0000 mg | ORAL_CAPSULE | Freq: Two times a day (BID) | ORAL | Status: AC

## 2015-07-16 MED ORDER — PREDNISONE 20 MG PO TABS: ORAL_TABLET | ORAL | Status: AC

## 2015-07-16 NOTE — Progress Notes (Signed)
CC: Matthew Marks is a 40 y.o. male is here for Otitis Media   Subjective: HPI:  Levaquin provided him with significant improvement with respect to right ear pain and drainage. He still has some trace drainage and a crackling sound in his ear he denies any nasal congestion or left ear discomfort. He denies any fevers, chills or headache. He has had some decreased hearing in the right ear. He denies any blood coming from the right ear. He denies any recent head trauma. Overall he feels like he is improved however there still room for improvement in her back to his baseline with respect to his chronic right otitis.   Review Of Systems Outlined In HPI  No past medical history on file.  Past Surgical History  Procedure Laterality Date  . Tympanostomy tube placement      as a child and as an adult  . Left knee surgery ligament tension     Family History  Problem Relation Age of Onset  . Prostate cancer Father   . Diabetes Mother   . Hypertension Father     Social History   Social History  . Marital Status: Married    Spouse Name: N/A  . Number of Children: N/A  . Years of Education: N/A   Occupational History  . Not on file.   Social History Main Topics  . Smoking status: Never Smoker   . Smokeless tobacco: Not on file  . Alcohol Use: 7.2 oz/week    12 Standard drinks or equivalent per week  . Drug Use: No  . Sexual Activity:    Partners: Female   Other Topics Concern  . Not on file   Social History Narrative     Objective: BP 124/85 mmHg  Pulse 66  Wt 160 lb (72.576 kg)  General: Alert and Oriented, No Acute Distress HEENT: Pupils equal, round, reactive to light. Conjunctivae clear.  External ears unremarkable, canals clear with intact TMs with appropriate landmarks Other than a blue tympanostomy tube in the right tympanic membrane..  Middle ear appears open without effusion on the left lower trace serous effusion on the right. Pink inferior turbinates.  Moist mucous  membranes, pharynx without inflammation nor lesions.  Neck supple without palpable lymphadenopathy nor abnormal masses. Lungs: Clear comfortable work of breathing Cardiac: Regular rate and rhythm.  Extremities: No peripheral edema.  Strong peripheral pulses.  Mental Status: No depression, anxiety, nor agitation. Skin: Warm and dry.  Assessment & Plan: Matthew Marks was seen today for otitis media.  Diagnoses and all orders for this visit:  Chronic otitis media of right ear, unspecified otitis media type -     cefdinir (OMNICEF) 300 MG capsule; Take 1 capsule (300 mg total) by mouth 2 (two) times daily. -     predniSONE (DELTASONE) 20 MG tablet; Three tabs daily days 1-3, two tabs daily days 4-6, one tab daily days 7-9, half tab daily days 10-13.   Combination of Omnicef and prednisone taper. Please call on Monday if not significantly improving.Signs and symptoms requring emergent/urgent reevaluation were discussed with the patient.  Return if symptoms worsen or fail to improve.

## 2015-08-04 ENCOUNTER — Ambulatory Visit (INDEPENDENT_AMBULATORY_CARE_PROVIDER_SITE_OTHER): Payer: Federal, State, Local not specified - PPO | Admitting: Family Medicine

## 2015-08-04 ENCOUNTER — Encounter: Payer: Self-pay | Admitting: Family Medicine

## 2015-08-04 VITALS — BP 136/89 | HR 65 | Wt 164.0 lb

## 2015-08-04 DIAGNOSIS — Z8619 Personal history of other infectious and parasitic diseases: Secondary | ICD-10-CM | POA: Diagnosis not present

## 2015-08-04 DIAGNOSIS — H9211 Otorrhea, right ear: Secondary | ICD-10-CM

## 2015-08-04 DIAGNOSIS — Z8669 Personal history of other diseases of the nervous system and sense organs: Secondary | ICD-10-CM

## 2015-08-04 MED ORDER — MELOXICAM 15 MG PO TABS: 15.0000 mg | ORAL_TABLET | Freq: Every day | ORAL | Status: DC

## 2015-08-04 MED ORDER — AMOXICILLIN-POT CLAVULANATE ER 1000-62.5 MG PO TB12: 1.0000 | ORAL_TABLET | Freq: Two times a day (BID) | ORAL | Status: DC

## 2015-08-04 NOTE — Progress Notes (Signed)
CC: Matthew Marks is a 40 y.o. male is here for Ear Drainage   Subjective: HPI:  Drainage from the right ear present for the last 2-3 days. Started after he finished prednisone. He admits that he wasn't taking Omnicef for 100% compliance. He denies any known intolerance. He denies any hearing loss, fevers, chills, sore throat but has some itching in the right eye. Denies drainage from the right eye. Symptoms are currently mild in severity   Review Of Systems Outlined In HPI  No past medical history on file.  Past Surgical History  Procedure Laterality Date  . Tympanostomy tube placement      as a child and as an adult  . Left knee surgery ligament tension     Family History  Problem Relation Age of Onset  . Prostate cancer Father   . Diabetes Mother   . Hypertension Father     Social History   Social History  . Marital Status: Married    Spouse Name: N/A  . Number of Children: N/A  . Years of Education: N/A   Occupational History  . Not on file.   Social History Main Topics  . Smoking status: Never Smoker   . Smokeless tobacco: Not on file  . Alcohol Use: 7.2 oz/week    12 Standard drinks or equivalent per week  . Drug Use: No  . Sexual Activity:    Partners: Female   Other Topics Concern  . Not on file   Social History Narrative     Objective: BP 136/89 mmHg  Pulse 65  Wt 164 lb (74.39 kg)  General: Alert and Oriented, No Acute Distress HEENT: Pupils equal, round, reactive to light. Conjunctivae clear.  External ears unremarkable, canals clear with intact TMs with appropriate landmarksThe right tympanic membrane has a blue tympanostomy tube with mild white drainage.  Middle ear appears open without effusion, . Pink inferior turbinates.  Moist mucous membranes, pharynx without inflammation nor lesions.  Neck supple without palpable lymphadenopathy nor abnormal masses. Lungs: Clear comfortable work of breathing Cardiac: Regular rate and rhythm. Extremities: No  peripheral edema.  Strong peripheral pulses.  Mental Status: No depression, anxiety, nor agitation. Skin: Warm and dry.  Assessment & Plan: Matthew Marks was seen today for ear drainage.  Diagnoses and all orders for this visit:  H/O chronic ear infection -     amoxicillin-clavulanate (AUGMENTIN XR) 1000-62.5 MG 12 hr tablet; Take 1 tablet by mouth 2 (two) times daily. -     Wound culture  Ear drainage right -     Wound culture  Other orders -     meloxicam (MOBIC) 15 MG tablet; Take 1 tablet (15 mg total) by mouth daily.   Start Augmentin XR and I showed him how to use a wound culture collection device to obtain a culture on the drainage that seems to be worse first thing in the morning. Please drop off this sample tomorrow.  He also like a refill meloxicam for minor aches and pains   Return if symptoms worsen or fail to improve.

## 2015-08-09 LAB — WOUND CULTURE: Gram Stain: NONE SEEN

## 2015-08-10 ENCOUNTER — Telehealth: Payer: Self-pay

## 2015-08-10 MED ORDER — CIPROFLOXACIN-DEXAMETHASONE 0.3-0.1 % OT SUSP: OTIC | Status: DC

## 2015-08-10 NOTE — Telephone Encounter (Signed)
Pt notified of medication changes.

## 2015-08-10 NOTE — Telephone Encounter (Signed)
Stop augmentin, switch to ciprofloxacin ear drops sent to [facility]

## 2015-08-10 NOTE — Telephone Encounter (Signed)
Pt called reporting that his ear is no better. Please advise.

## 2015-08-31 DIAGNOSIS — M9903 Segmental and somatic dysfunction of lumbar region: Secondary | ICD-10-CM | POA: Diagnosis not present

## 2015-08-31 DIAGNOSIS — M9905 Segmental and somatic dysfunction of pelvic region: Secondary | ICD-10-CM | POA: Diagnosis not present

## 2015-08-31 DIAGNOSIS — M9904 Segmental and somatic dysfunction of sacral region: Secondary | ICD-10-CM | POA: Diagnosis not present

## 2015-08-31 DIAGNOSIS — M6240 Contracture of muscle, unspecified site: Secondary | ICD-10-CM | POA: Diagnosis not present

## 2015-09-04 DIAGNOSIS — M9903 Segmental and somatic dysfunction of lumbar region: Secondary | ICD-10-CM | POA: Diagnosis not present

## 2015-09-04 DIAGNOSIS — M9905 Segmental and somatic dysfunction of pelvic region: Secondary | ICD-10-CM | POA: Diagnosis not present

## 2015-09-04 DIAGNOSIS — M9904 Segmental and somatic dysfunction of sacral region: Secondary | ICD-10-CM | POA: Diagnosis not present

## 2015-09-04 DIAGNOSIS — M6240 Contracture of muscle, unspecified site: Secondary | ICD-10-CM | POA: Diagnosis not present

## 2015-09-11 DIAGNOSIS — M9903 Segmental and somatic dysfunction of lumbar region: Secondary | ICD-10-CM | POA: Diagnosis not present

## 2015-09-11 DIAGNOSIS — M6240 Contracture of muscle, unspecified site: Secondary | ICD-10-CM | POA: Diagnosis not present

## 2015-09-11 DIAGNOSIS — M9904 Segmental and somatic dysfunction of sacral region: Secondary | ICD-10-CM | POA: Diagnosis not present

## 2015-09-11 DIAGNOSIS — M9905 Segmental and somatic dysfunction of pelvic region: Secondary | ICD-10-CM | POA: Diagnosis not present

## 2015-09-18 DIAGNOSIS — M9904 Segmental and somatic dysfunction of sacral region: Secondary | ICD-10-CM | POA: Diagnosis not present

## 2015-09-18 DIAGNOSIS — M9903 Segmental and somatic dysfunction of lumbar region: Secondary | ICD-10-CM | POA: Diagnosis not present

## 2015-09-18 DIAGNOSIS — M6240 Contracture of muscle, unspecified site: Secondary | ICD-10-CM | POA: Diagnosis not present

## 2015-09-18 DIAGNOSIS — M9905 Segmental and somatic dysfunction of pelvic region: Secondary | ICD-10-CM | POA: Diagnosis not present

## 2015-10-13 ENCOUNTER — Encounter: Payer: Self-pay | Admitting: Family Medicine

## 2015-10-13 ENCOUNTER — Ambulatory Visit (INDEPENDENT_AMBULATORY_CARE_PROVIDER_SITE_OTHER): Payer: Federal, State, Local not specified - PPO | Admitting: Family Medicine

## 2015-10-13 VITALS — BP 142/91 | HR 61 | Wt 167.0 lb

## 2015-10-13 DIAGNOSIS — B353 Tinea pedis: Secondary | ICD-10-CM | POA: Diagnosis not present

## 2015-10-13 DIAGNOSIS — N529 Male erectile dysfunction, unspecified: Secondary | ICD-10-CM | POA: Diagnosis not present

## 2015-10-13 DIAGNOSIS — R41840 Attention and concentration deficit: Secondary | ICD-10-CM | POA: Insufficient documentation

## 2015-10-13 DIAGNOSIS — Z8619 Personal history of other infectious and parasitic diseases: Secondary | ICD-10-CM | POA: Diagnosis not present

## 2015-10-13 DIAGNOSIS — Z8669 Personal history of other diseases of the nervous system and sense organs: Secondary | ICD-10-CM

## 2015-10-13 MED ORDER — TERBINAFINE HCL 250 MG PO TABS: 250.0000 mg | ORAL_TABLET | Freq: Every day | ORAL | 0 refills | Status: DC

## 2015-10-13 MED ORDER — SILDENAFIL CITRATE 20 MG PO TABS: ORAL_TABLET | ORAL | 0 refills | Status: DC

## 2015-10-13 NOTE — Progress Notes (Signed)
CC: Matthew Marks is a 40 y.o. male is here for Tinea Pedis; Otalgia; and ADHD   Subjective: HPI:  Recently got back from the beach and is concerned that maybe he is starting to get ear infection. He denies any ear pain or decreased hearing from his baseline. He denies any ear drainage. No fevers, chills or sore throat.  Follow her pelvis function: He is requesting a refill on generic Viagra. He states it helps with both initiating and maintaining erection, an issue that he's had trouble with in the past.  Complains of bilateral feet itching. He's been using clotrimazole off and on for the past week. Symptoms came on after he started wearing his workout shoes to the gym without socks on. Occasional flaking of both small toes. Denies any skin lesions elsewhere.  He tells me that ever since he was in primary school he's had difficulty with pain attention in class. Recently his distractibility has been interfering with his productivity at work and he wants to be tested for ADHD.   Review Of Systems Outlined In HPI  No past medical history on file.  Past Surgical History:  Procedure Laterality Date  . left knee surgery ligament tension    . TYMPANOSTOMY TUBE PLACEMENT     as a child and as an adult   Family History  Problem Relation Age of Onset  . Prostate cancer Father   . Diabetes Mother   . Hypertension Father     Social History   Social History  . Marital status: Married    Spouse name: Matthew Marks  . Number of children: Matthew Marks  . Years of education: Matthew Marks   Occupational History  . Not on file.   Social History Main Topics  . Smoking status: Never Smoker  . Smokeless tobacco: Not on file  . Alcohol use 7.2 oz/week    12 Standard drinks or equivalent per week  . Drug use: No  . Sexual activity: Yes    Partners: Female   Other Topics Concern  . Not on file   Social History Narrative  . No narrative on file     Objective: BP (!) 142/91   Pulse 61   Wt 167 lb (75.8 kg)    BMI 26.16 kg/m   General: Alert and Oriented, No Acute Distress HEENT: Pupils equal, round, reactive to light. Conjunctivae clear.  External ears unremarkable, canals clear Blue tympanostomy tube in place through the right eardrum without any discharge.  Middle ear appears open without effusion. Pink inferior turbinates.  Moist mucous membranes, pharynx without inflammation nor lesions.  Neck supple without palpable lymphadenopathy nor abnormal masses. Lungs: Clear to auscultation bilaterally Cardiac: Regular rate and rhythm. Extremities: No peripheral edema.  Strong peripheral pulses.  Mental Status: No depression, anxiety, nor agitation. Skin: Warm and dry. Mild cracking and scaling of the webspaces in both feet.  Assessment & Plan: [name] was seen today for tinea pedis, otalgia and adhd.  Diagnoses and all orders for this visit:  H/O chronic ear infection  Erectile dysfunction, unspecified erectile dysfunction type -     sildenafil (REVATIO) 20 MG tablet; 2-4 tabs by mouth only as needed for sex  Tinea pedis, unspecified laterality -     terbinafine (LAMISIL) 250 MG tablet; Take 1 tablet (250 mg total) by mouth daily.  Poor concentration -     Ambulatory referral to Psychology  No signs of any infection, I be happy to send in ciprofloxacin if symptoms occur in the future.  Erectile dysfunction: Continue sildenafil Tinea pedis: Start terbinafine stop clotrimazole Poor concentration: Referral to [place] attention specialist for testing    Return if symptoms worsen or fail to improve.

## 2015-12-02 DIAGNOSIS — F902 Attention-deficit hyperactivity disorder, combined type: Secondary | ICD-10-CM | POA: Diagnosis not present

## 2015-12-02 DIAGNOSIS — Z79899 Other long term (current) drug therapy: Secondary | ICD-10-CM | POA: Diagnosis not present

## 2015-12-02 DIAGNOSIS — R41840 Attention and concentration deficit: Secondary | ICD-10-CM | POA: Diagnosis not present

## 2015-12-02 DIAGNOSIS — F419 Anxiety disorder, unspecified: Secondary | ICD-10-CM | POA: Diagnosis not present

## 2015-12-05 ENCOUNTER — Other Ambulatory Visit: Payer: Self-pay | Admitting: Family Medicine

## 2015-12-05 DIAGNOSIS — N529 Male erectile dysfunction, unspecified: Secondary | ICD-10-CM

## 2015-12-10 ENCOUNTER — Ambulatory Visit (INDEPENDENT_AMBULATORY_CARE_PROVIDER_SITE_OTHER): Payer: Federal, State, Local not specified - PPO | Admitting: Sports Medicine

## 2015-12-10 ENCOUNTER — Encounter: Payer: Self-pay | Admitting: Sports Medicine

## 2015-12-10 DIAGNOSIS — L03011 Cellulitis of right finger: Secondary | ICD-10-CM

## 2015-12-10 MED ORDER — DOXYCYCLINE HYCLATE 100 MG PO TABS: 100.0000 mg | ORAL_TABLET | Freq: Two times a day (BID) | ORAL | 0 refills | Status: AC

## 2015-12-10 NOTE — Patient Instructions (Signed)
Paronychia °Paronychia is an infection of the skin that surrounds a nail. It usually affects the skin around a fingernail, but it may also occur near a toenail. It often causes pain and swelling around the nail. This condition may come on suddenly or develop over a longer period. In some cases, a collection of pus (abscess) can form near or under the nail. Usually, paronychia is not serious and it clears up with treatment. °CAUSES °This condition may be caused by bacteria or fungi. It is commonly caused by either Streptococcus or Staphylococcus bacteria. The bacteria or fungi often cause the infection by getting into the affected area through an opening in the skin, such as a cut or a hangnail. °RISK FACTORS °This condition is more likely to develop in: °· People who get their hands wet often, such as those who work as dishwashers, bartenders, or nurses. °· People who bite their fingernails or suck their thumbs. °· People who trim their nails too short. °· People who have hangnails or injured fingertips. °· People who get manicures. °· People who have diabetes. °SYMPTOMS °Symptoms of this condition include: °· Redness and swelling of the skin near the nail. °· Tenderness around the nail when you touch the area. °· Pus-filled bumps under the cuticle. The cuticle is the skin at the base or sides of the nail. °· Fluid or pus under the nail. °· Throbbing pain in the area. °DIAGNOSIS °This condition is usually diagnosed with a physical exam. In some cases, a sample of pus may be taken from an abscess to be tested in a lab. This can help to determine what type of bacteria or fungi is causing the condition. °TREATMENT °Treatment for this condition depends on the cause and severity of the condition. If the condition is mild, it may clear up on its own in a few days. Your health care provider may recommend soaking the affected area in warm water a few times a day. When treatment is needed, the options may  include: °· Antibiotic medicine, if the condition is caused by a bacterial infection. °· Antifungal medicine, if the condition is caused by a fungal infection. °· Incision and drainage, if an abscess is present. In this procedure, the health care provider will cut open the abscess so the pus can drain out. °HOME CARE INSTRUCTIONS °· Soak the affected area in warm water if directed to do so by your health care provider. You may be told to do this for 20 minutes, 2-3 times a day. Keep the area dry in between soakings. °· Take medicines only as directed by your health care provider. °· If you were prescribed an antibiotic medicine, finish all of it even if you start to feel better. °· Keep the affected area clean. °· Do not try to drain a fluid-filled bump yourself. °· If you will be washing dishes or performing other tasks that require your hands to get wet, wear rubber gloves. You should also wear gloves if your hands might come in contact with irritating substances, such as cleaners or chemicals. °· Follow your health care provider's instructions about: °¨ Wound care. °¨ Bandage (dressing) changes and removal. °SEEK MEDICAL CARE IF: °· Your symptoms get worse or do not improve with treatment. °· You have a fever or chills. °· You have redness spreading from the affected area. °· You have continued or increased fluid, blood, or pus coming from the affected area. °· Your finger or knuckle becomes swollen or is difficult to move. °  °  This information is not intended to replace advice given to you by your health care provider. Make sure you discuss any questions you have with your health care provider. °  °Document Released: 08/17/2000 Document Revised: 07/08/2014 Document Reviewed: 01/29/2014 °Elsevier Interactive Patient Education ©2016 [employer] ° °

## 2015-12-10 NOTE — Assessment & Plan Note (Signed)
With prominent granulation tissue, and attempted drainage by patient at home. Doxycycline, return in one week, we will excise the granulation tissue and do a linear I&D if no better.

## 2015-12-10 NOTE — Progress Notes (Signed)
   Subjective:    I'm seeing this patient as a consultation for:  Dr. Laren BoomSean Hommel  CC: Right finger pain  HPI: For several days this pleasant 40 year old male has had increasing pain that he localizes at the tip of his right index finger, moderate, persistent without radiation, swelling at the proximal nail fold, he attempted to drain it himself with a pain, it did not get any purulence, only blood.  Past medical history:  Negative.  See flowsheet/record as well for more information.  Surgical history: Negative.  See flowsheet/record as well for more information.  Family history: Negative.  See flowsheet/record as well for more information.  Social history: Negative.  See flowsheet/record as well for more information.  Allergies, and medications have been entered into the medical record, reviewed, and no changes needed.   Review of Systems: No headache, visual changes, nausea, vomiting, diarrhea, constipation, dizziness, abdominal pain, skin rash, fevers, chills, night sweats, weight loss, swollen lymph nodes, body aches, joint swelling, muscle aches, chest pain, shortness of breath, mood changes, visual or auditory hallucinations.   Objective:   General: Well Developed, well nourished, and in no acute distress.  Neuro/Psych: Alert and oriented x3, extra-ocular muscles intact, able to move all 4 extremities, sensation grossly intact. Skin: Warm and dry, no rashes noted.  Respiratory: Not using accessory muscles, speaking in full sentences, trachea midline.  Cardiovascular: Pulses palpable, no extremity edema. Abdomen: Does not appear distended. Right hand: Visible paronychia at the proximal nail full. Neurovascularly intact distally. I'm unable to express any drainage with pressure. Good range of motion, good strength.  Impression and Recommendations:   This case required medical decision making of moderate complexity.  Paronychia of right index finger With prominent granulation  tissue, and attempted drainage by patient at home. Doxycycline, return in one week, we will excise the granulation tissue and do a linear I&D if no better.

## 2015-12-18 ENCOUNTER — Ambulatory Visit (INDEPENDENT_AMBULATORY_CARE_PROVIDER_SITE_OTHER): Payer: Federal, State, Local not specified - PPO | Admitting: Sports Medicine

## 2015-12-18 ENCOUNTER — Encounter: Payer: Self-pay | Admitting: Sports Medicine

## 2015-12-18 DIAGNOSIS — L03011 Cellulitis of right finger: Secondary | ICD-10-CM

## 2015-12-18 MED ORDER — SULFAMETHOXAZOLE-TRIMETHOPRIM 800-160 MG PO TABS: 1.0000 | ORAL_TABLET | Freq: Two times a day (BID) | ORAL | 0 refills | Status: DC

## 2015-12-18 MED ORDER — HYDROCODONE-ACETAMINOPHEN 5-325 MG PO TABS: 1.0000 | ORAL_TABLET | Freq: Three times a day (TID) | ORAL | 0 refills | Status: DC | PRN

## 2015-12-18 NOTE — Progress Notes (Signed)
  Subjective:    CC: Follow-up  HPI: Paronychia: Right index finger, we tried a course of doxycycline which did not seem to control symptoms. He agrees to proceed with I&D. Symptoms are moderate, persistent without radiation.  Past medical history:  Negative.  See flowsheet/record as well for more information.  Surgical history: Negative.  See flowsheet/record as well for more information.  Family history: Negative.  See flowsheet/record as well for more information.  Social history: Negative.  See flowsheet/record as well for more information.  Allergies, and medications have been entered into the medical record, reviewed, and no changes needed.   Review of Systems: No fevers, chills, night sweats, weight loss, chest pain, or shortness of breath.   Objective:    General: Well Developed, well nourished, and in no acute distress.  Neuro: Alert and oriented x3, extra-ocular muscles intact, sensation grossly intact.  HEENT: Normocephalic, atraumatic, pupils equal round reactive to light, neck supple, no masses, no lymphadenopathy, thyroid nonpalpable.  Skin: Warm and dry, no rashes. Cardiac: Regular rate and rhythm, no murmurs rubs or gallops, no lower extremity edema.  Respiratory: Clear to auscultation bilaterally. Not using accessory muscles, speaking in full sentences. Right hand: Visible paronychia, swollen, erythematous, minimally tender.  Incision and drainage of right index finger paronychia. Risks, benefits, and alternatives explained and consent obtained. Time out conducted. Surface cleaned with alcohol. 2 mL lidocaine injected under ultrasound guidance into the flexor tendon sheath Adequate anesthesia ensured. Area prepped and draped in a sterile fashion. #11 blade used to make a stab incision into abscess. Only minimal purulence expressed Pressure dressing applied. Hemostasis achieved. Pt stable. Aftercare and follow-up advised.  Impression and Recommendations:     Paronychia of right index finger Digital block, incision and drainage, Septra. Return to see me in one week for a wound check. A small a lot of hydrocodone for postprocedural pain.

## 2015-12-18 NOTE — Assessment & Plan Note (Addendum)
Digital block, incision and drainage, Septra. Return to see me in one week for a wound check. A small a lot of hydrocodone for postprocedural pain.

## 2015-12-20 DIAGNOSIS — L03011 Cellulitis of right finger: Secondary | ICD-10-CM | POA: Diagnosis not present

## 2015-12-20 DIAGNOSIS — M79644 Pain in right finger(s): Secondary | ICD-10-CM | POA: Diagnosis not present

## 2015-12-21 LAB — WOUND CULTURE
Gram Stain: NONE SEEN
Gram Stain: NONE SEEN
Gram Stain: NONE SEEN
Organism ID, Bacteria: NORMAL

## 2015-12-25 ENCOUNTER — Ambulatory Visit (INDEPENDENT_AMBULATORY_CARE_PROVIDER_SITE_OTHER): Payer: Federal, State, Local not specified - PPO | Admitting: Sports Medicine

## 2015-12-25 DIAGNOSIS — L03011 Cellulitis of right finger: Secondary | ICD-10-CM

## 2015-12-25 MED ORDER — FLUCONAZOLE 150 MG PO TABS: 300.0000 mg | ORAL_TABLET | ORAL | 0 refills | Status: DC

## 2015-12-25 MED ORDER — HYDROCODONE-ACETAMINOPHEN 10-325 MG PO TABS: 1.0000 | ORAL_TABLET | Freq: Three times a day (TID) | ORAL | 0 refills | Status: DC | PRN

## 2015-12-25 NOTE — Progress Notes (Signed)
  Subjective:    CC: Follow-up  HPI: Paronychia: Persistent despite Septra, doxycycline, and recently had to go to the emergency department, was switched to clindamycin and had a second incision and drainage performed. Unfortunately he continues to have pain, swelling, although it does feel slightly better. Pain is severe, persistent, he does need 10 mg of hydrocodone to control his pain.  Past medical history:  Negative.  See flowsheet/record as well for more information.  Surgical history: Negative.  See flowsheet/record as well for more information.  Family history: Negative.  See flowsheet/record as well for more information.  Social history: Negative.  See flowsheet/record as well for more information.  Allergies, and medications have been entered into the medical record, reviewed, and no changes needed.   Review of Systems: No fevers, chills, night sweats, weight loss, chest pain, or shortness of breath.   Objective:    General: Well Developed, well nourished, and in no acute distress.  Neuro: Alert and oriented x3, extra-ocular muscles intact, sensation grossly intact.  HEENT: Normocephalic, atraumatic, pupils equal round reactive to light, neck supple, no masses, no lymphadenopathy, thyroid nonpalpable.  Skin: Warm and dry, no rashes. Cardiac: Regular rate and rhythm, no murmurs rubs or gallops, no lower extremity edema.  Respiratory: Clear to auscultation bilaterally. Not using accessory muscles, speaking in full sentences. Right hand: Index finger is swollen, two visible incisions, neurovascularly intact.  Impression and Recommendations:    Paronychia of right index finger Persistence of symptoms despite a second incision and drainage. He has been on multiple antibiotics that would cover staph aureus including doxycycline, Septra, clindamycin currently. Adding fungal coverage with Diflucan 300 mg weekly for 2 weeks.  I spent 25 minutes with this patient, greater than 50% was  face-to-face time counseling regarding the above diagnoses

## 2015-12-25 NOTE — Assessment & Plan Note (Signed)
Persistence of symptoms despite a second incision and drainage. He has been on multiple antibiotics that would cover staph aureus including doxycycline, Septra, clindamycin currently. Adding fungal coverage with Diflucan 300 mg weekly for 2 weeks.

## 2016-01-08 ENCOUNTER — Ambulatory Visit (INDEPENDENT_AMBULATORY_CARE_PROVIDER_SITE_OTHER): Payer: Federal, State, Local not specified - PPO | Admitting: Family Medicine

## 2016-01-08 DIAGNOSIS — L918 Other hypertrophic disorders of the skin: Secondary | ICD-10-CM | POA: Insufficient documentation

## 2016-01-08 DIAGNOSIS — L03011 Cellulitis of right finger: Secondary | ICD-10-CM | POA: Diagnosis not present

## 2016-01-08 MED ORDER — FLUCONAZOLE 150 MG PO TABS: 300.0000 mg | ORAL_TABLET | ORAL | 0 refills | Status: DC

## 2016-01-08 NOTE — Progress Notes (Signed)
       Matthew HoveKevin Marks is a 40 y.o. male who [facility] today for follow-up paronychia and discuss skin tag on chest.  Paronychia: Patient has been seen several times over the last month for paronychia involving the right index finger. This is been treated with multiple antibiotics and fluconazole least once. Culture has so far been negative. He notes following drainage in the emergency department he's feeling a bit better. He notes the redness and pain and swelling have improved. He denies any fevers or chills vomiting or diarrhea.  Skin tag: Patient has a skin tag on the anterior chest discomfort present for several months. Is annoying and irritated with his seatbelt. He has not had any treatment for this issue yet.   No past medical history on file. Past Surgical History:  Procedure Laterality Date  . left knee surgery ligament tension    . TYMPANOSTOMY TUBE PLACEMENT     as a child and as an adult   Social History  Substance Use Topics  . Smoking status: Never Smoker  . Smokeless tobacco: Not on file  . Alcohol use 7.2 oz/week    12 Standard drinks or equivalent per week   family history includes Diabetes in his mother; Hypertension in his father; Prostate cancer in his father.  ROS as above:  Medications: Current Outpatient Prescriptions  Medication Sig Dispense Refill  . fluconazole (DIFLUCAN) 150 MG tablet Take 2 tablets (300 mg total) by mouth once a week. 4 tablet 0  . meloxicam (MOBIC) 15 MG tablet Take 1 tablet (15 mg total) by mouth daily. 30 tablet 2  . sildenafil (REVATIO) 20 MG tablet TAKE TWO TO FOUR TABLETS BY MOUTH ONLY AS NEEDED FOR SEX 50 tablet 0   No current facility-administered medications for this visit.    No Known Allergies  Health Maintenance Health Maintenance  Topic Date Due  . HIV Screening  [birth year]1988  . INFLUENZA  VACCINE  10/06/2015  . TETANUS/TDAP  03/07/2020     Exam:  BP 119/83   Pulse (!) 59   Wt 165 lb (74.8 kg)   BMI 25.84 kg/m  Gen: Well NAD Skin: Right index finger erythematous mildly tender with no fluctuance distal phalanx. No expressible pus.  Chest wall: Small irritated skin tag anterior chest wall present  No results found for this or any previous visit (from the past 72 hour(s)). No results found.    Assessment and Plan: 40 y.o. male with  Paronychia: Significantly improved but not resolved. Repeat course of fluconazole and recheck in the near future if not improved.  Skin tag: Irritated. Discussed options. I would like to wait on cryotherapy or excision until the paronychia has healed fully for fear of causing an infection on his chest wall.  Recommend patient follow-up for a wellness visit in February.   No orders of the defined types were placed in this encounter.   Discussed warning signs or symptoms. Please see discharge instructions. Patient expresses understanding.

## 2016-01-08 NOTE — Patient Instructions (Signed)
Thank you for coming in today. Repeat antifungal fluconazole.  Return for well visit after your birthday.  Return sooner if needed.

## 2016-01-18 DIAGNOSIS — K080 Exfoliation of teeth due to systemic causes: Secondary | ICD-10-CM | POA: Diagnosis not present

## 2016-02-02 DIAGNOSIS — K080 Exfoliation of teeth due to systemic causes: Secondary | ICD-10-CM | POA: Diagnosis not present

## 2016-02-25 DIAGNOSIS — K080 Exfoliation of teeth due to systemic causes: Secondary | ICD-10-CM | POA: Diagnosis not present

## 2016-03-31 DIAGNOSIS — K080 Exfoliation of teeth due to systemic causes: Secondary | ICD-10-CM | POA: Diagnosis not present

## 2016-04-19 ENCOUNTER — Encounter: Payer: Self-pay | Admitting: Family Medicine

## 2016-04-19 ENCOUNTER — Ambulatory Visit (INDEPENDENT_AMBULATORY_CARE_PROVIDER_SITE_OTHER): Payer: Federal, State, Local not specified - PPO | Admitting: Family Medicine

## 2016-04-19 VITALS — BP 126/80 | HR 72 | Wt 167.0 lb

## 2016-04-19 DIAGNOSIS — Z0000 Encounter for general adult medical examination without abnormal findings: Secondary | ICD-10-CM | POA: Diagnosis not present

## 2016-04-19 DIAGNOSIS — F411 Generalized anxiety disorder: Secondary | ICD-10-CM

## 2016-04-19 NOTE — Progress Notes (Signed)
Matthew HoveKevin Marks is a 41 y.o. male who [facility] today for well adult.   Matthew Marks is a healthy 41 year old male with only a few medical problem. He feels well and only notes anxiety and irritability as a issue today. She denies chest pain palpitations or shortness of breath.  He does not take any medications regularly. He tries to exercise with regularity.  He notes anxiety and irritability difficulty concentrating and relaxing. He would like to try therapy before take medications if possible. He does note that his anxiety symptoms are interfering with his quality of life. His wife and children have noted it and think that it's a problem.   Past Medical History:  Diagnosis Date  . H/O chronic ear infection 07/04/2014   Past Surgical History:  Procedure Laterality Date  . left knee surgery ligament tension    . TYMPANOSTOMY TUBE PLACEMENT     as a child and as an adult   Social History  Substance Use Topics  . Smoking status: Never Smoker  . Smokeless tobacco: Never Used  . Alcohol use 7.2 oz/week    12 Standard drinks or equivalent per week   family history includes Diabetes in his mother; Hypertension in his father; Prostate cancer in his father.  ROS as above:  Medications: Current Outpatient Prescriptions  Medication Sig Dispense Refill  . sildenafil (REVATIO) 20 MG tablet TAKE TWO TO FOUR TABLETS BY MOUTH ONLY AS NEEDED FOR SEX 50 tablet 0   No current facility-administered medications for this visit.    No Known Allergies  Health Maintenance Health Maintenance  Topic Date Due  . TETANUS/TDAP  03/07/2020  . INFLUENZA VACCINE  Completed  . HIV Screening  Addressed     Exam:  BP 126/80   Pulse 72   Wt 167 lb (75.8 kg)   BMI 26.16 kg/m  Gen: Well NAD HEENT: EOMI,  MMM Lungs: Normal work of breathing. CTABL Heart: RRR no MRG Abd:  NABS, Soft. Nondistended, Nontender Exts: Brisk capillary refill, warm and well perfused.  Psych: Alert and oriented normal speech thought process and affect. No SI or HI expressed.  Depression screen PHQ 2/9 04/19/2016  Decreased Interest 1  Down, Depressed, Hopeless 1  PHQ - 2 Score 2  Altered sleeping 0  Tired, decreased energy 2  Change in appetite 2  Feeling bad or failure about yourself  1  Trouble concentrating 2  Moving slowly or fidgety/restless 0  Suicidal thoughts 0  PHQ-9 Score 9   GAD 7 : Generalized Anxiety Score 04/19/2016  Nervous, Anxious, on Edge 3  Control/stop worrying 3  Worry too much - different things 3  Trouble relaxing 2  Restless 3  Easily annoyed or irritable 3  Afraid - awful might happen 2  Total GAD 7 Score 19  Anxiety Difficulty Very difficult      No results found for this or any previous visit (from the past 72 hour(s)). No results found.    Assessment and Plan: 41 y.o. male with Well adult. Doing reasonably well. Labs are reasonably up-to-date. We'll plan to refer for counseling and recheck via phone or text encounter is doing well in a few months. If not doing well return to clinic.   Orders Placed This Encounter  Procedures  . Ambulatory referral to Psychology    Referral Priority:   Routine    Referral Type:   Psychiatric  Referral Reason:   Specialty Services Required    Requested Specialty:   Psychology    Number of Visits Requested:   1   No orders of the defined types were placed in this encounter.    Discussed warning signs or symptoms. Please see discharge instructions. Patient expresses understanding.

## 2016-04-19 NOTE — Patient Instructions (Signed)
Thank you for coming in today. You should hear from therapy soon.  Let me know how you are doing in about a few months.  Let me know if you never hear from therapy. Call or go to the emergency room if you get worse, have trouble breathing, have chest pains, or palpitations.

## 2016-05-09 IMAGING — CR DG FOOT COMPLETE 3+V*R*
3 series · 3 of 3 positions shown · non-contrast
Comparison: None.

CLINICAL DATA: Retrocalcaneal bursitis for 1 year, initial
encounter

EXAM:
RIGHT FOOT COMPLETE - 3+ VIEW

[foot ap]
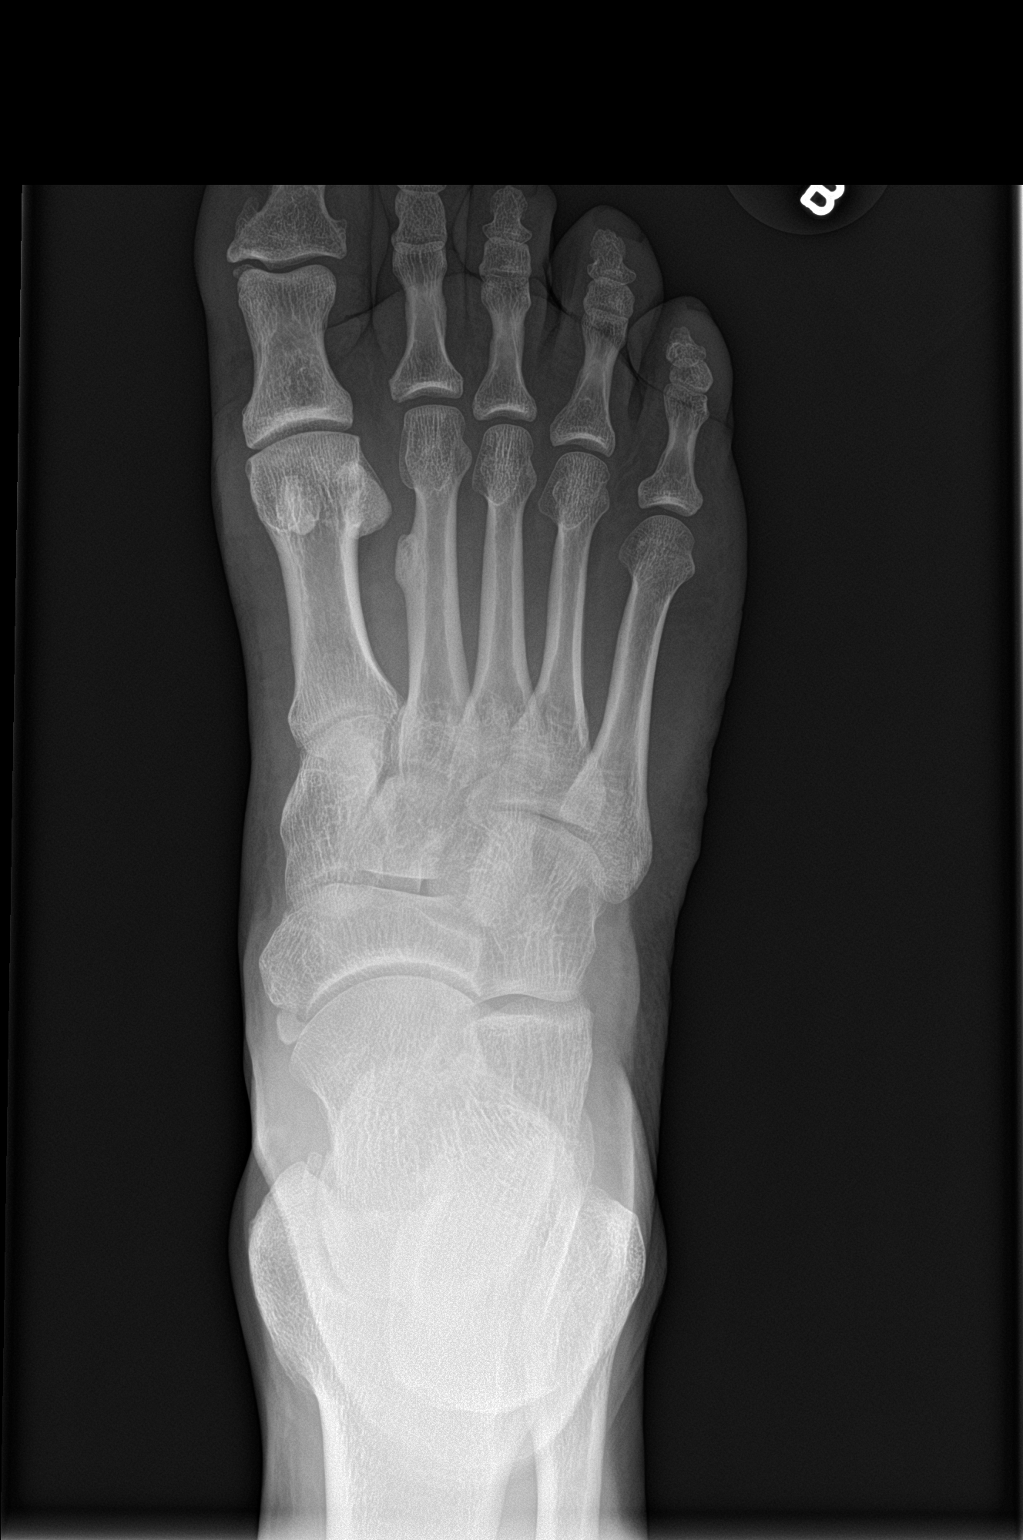

[foot obl]
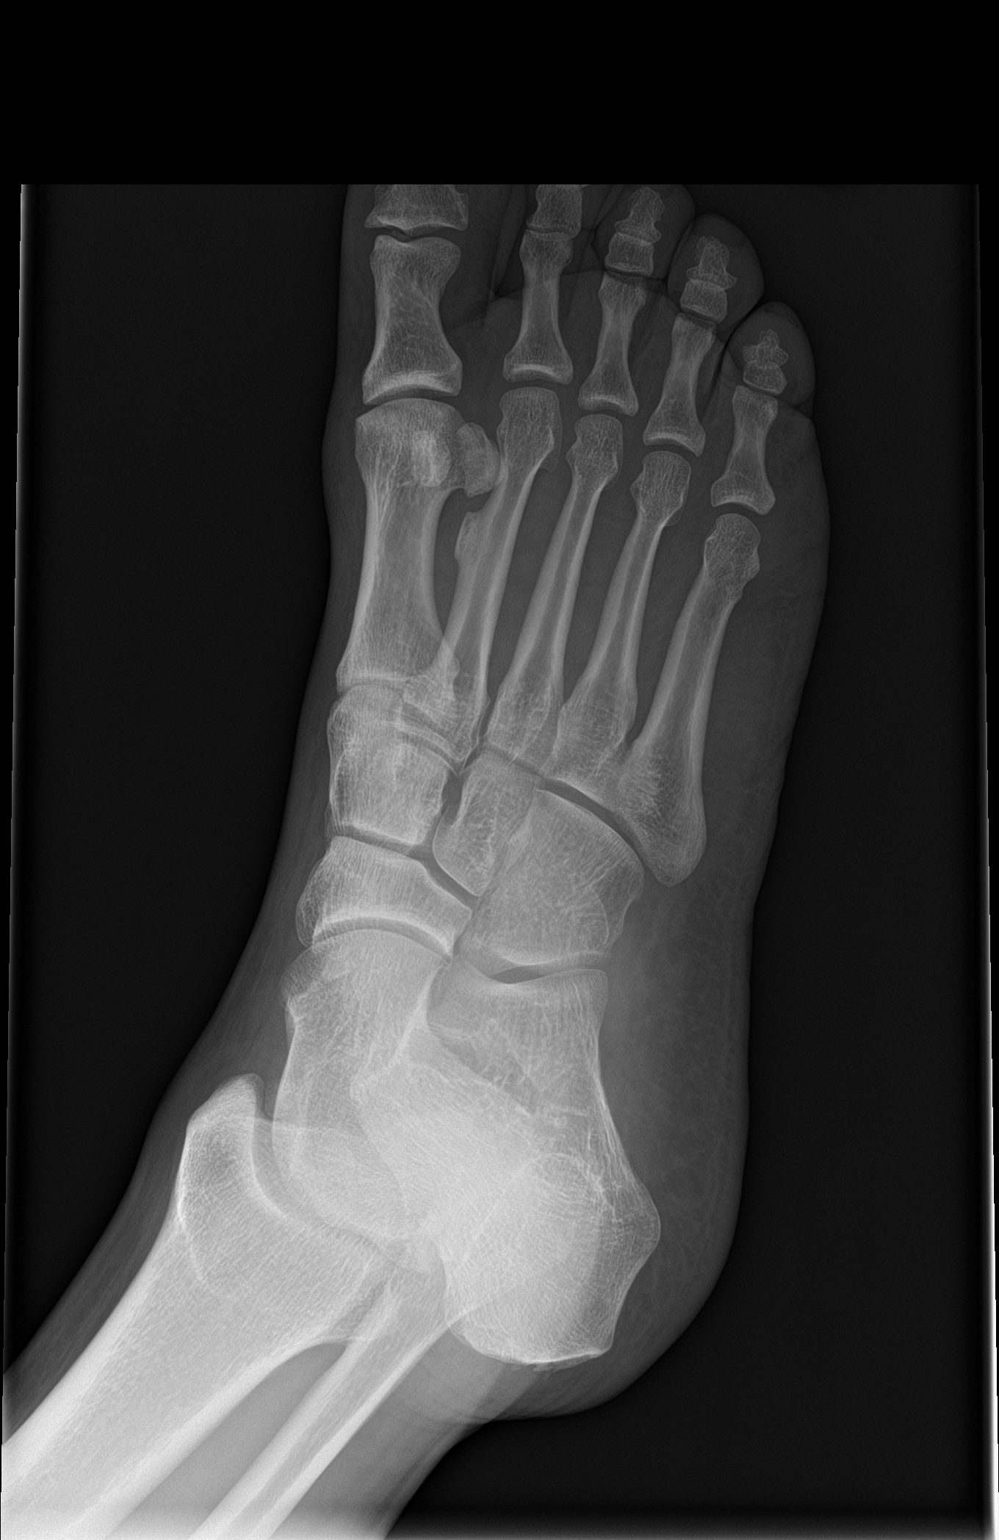

[foot lat]
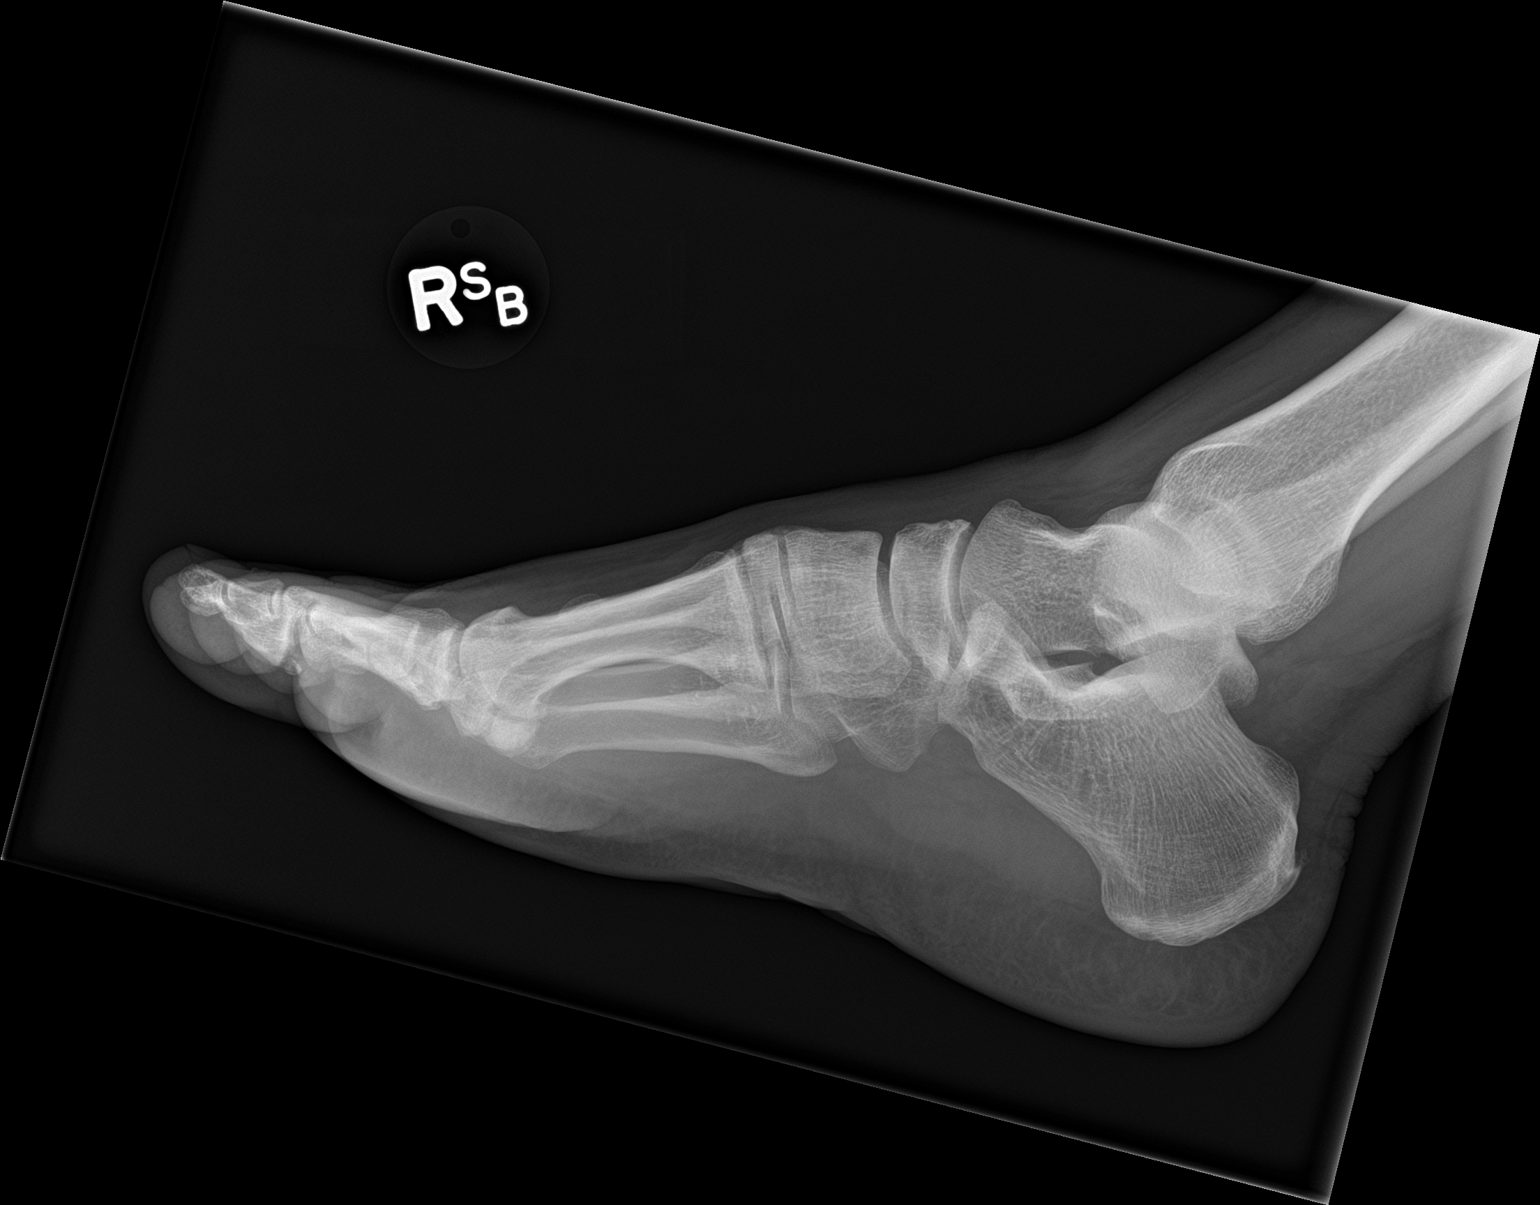

[3 of 3 positions shown; findings below may reference images not displayed]

FINDINGS: There is no evidence of fracture or dislocation. There is no
evidence of arthropathy or other focal bone abnormality. Soft
tissues are unremarkable.
IMPRESSION: No acute abnormality noted.

## 2016-05-24 ENCOUNTER — Ambulatory Visit (INDEPENDENT_AMBULATORY_CARE_PROVIDER_SITE_OTHER): Payer: Federal, State, Local not specified - PPO | Admitting: Licensed Clinical Social Worker

## 2016-05-24 DIAGNOSIS — F411 Generalized anxiety disorder: Secondary | ICD-10-CM

## 2016-05-24 DIAGNOSIS — F331 Major depressive disorder, recurrent, moderate: Secondary | ICD-10-CM | POA: Diagnosis not present

## 2016-05-24 DIAGNOSIS — F1090 Alcohol use, unspecified, uncomplicated: Secondary | ICD-10-CM

## 2016-05-24 DIAGNOSIS — Z7289 Other problems related to lifestyle: Secondary | ICD-10-CM

## 2016-05-24 DIAGNOSIS — F1010 Alcohol abuse, uncomplicated: Secondary | ICD-10-CM

## 2016-05-25 DIAGNOSIS — Z7289 Other problems related to lifestyle: Secondary | ICD-10-CM | POA: Insufficient documentation

## 2016-05-25 DIAGNOSIS — F331 Major depressive disorder, recurrent, moderate: Secondary | ICD-10-CM | POA: Insufficient documentation

## 2016-05-25 DIAGNOSIS — F1090 Alcohol use, unspecified, uncomplicated: Secondary | ICD-10-CM | POA: Insufficient documentation

## 2016-05-25 NOTE — Progress Notes (Signed)
Comprehensive Clinical Assessment (CCA) Note  05/25/2016 Matthew HoveKevin Marks [id number]  Visit Diagnosis:      ICD-9-CM ICD-10-CM   1. GAD (generalized anxiety disorder) 300.02 F41.1   2. MDD (major depressive disorder), recurrent episode, moderate (HCC) 296.32 F33.1   3. Chronic alcohol use V49.89 F10.10       CCA Part One  Part One has been completed on paper by the patient.  (See scanned document in Chart Review)  CCA Part Two A  Intake/Chief Complaint:  CCA Intake With Chief Complaint CCA Part Two Date: 05/24/16 CCA Part Two Time: 1015 Chief Complaint/Presenting Problem: Irritability and anxiety Patients Currently Reported Symptoms/Problems: Very easily irritated  "I curse a lot"  Volume of his voice increases.  It is a daily problem.  "I self medicate by drinking beer and wine every day.  It helps me relax and sleep."  3-5 drinks each night  Most days of the week.   Individual's Strengths: He is athletic  "I am very driven with certain things." Individual's Preferences: "I hate being so negative and angry."  Wants to have more patience.   Type of Services Patient Feels Are Needed: Therapy Initial Clinical Notes/Concerns: Noted he has had issues with anger for a long time but never sought help for it until now.  In August he reported having concerns about inattention.  He was referred to [facility].  ADHD was ruled out.  Mental Health Symptoms Depression:  Depression: Fatigue, Tearfulness, Worthlessness, Increase/decrease in appetite, Irritability, Change in energy/activity, Difficulty Concentrating  Mania:  Mania: N/A  Anxiety:   Anxiety: Worrying, Tension, Irritability, Fatigue, Difficulty concentrating  Psychosis:  Psychosis: N/A  Trauma:  Trauma: N/A  Obsessions:  Obsessions: N/A  Compulsions:  Compulsions: N/A  Inattention:     Hyperactivity/Impulsivity:  Hyperactivity/Impulsivity: Feeling of restlessness  Oppositional/Defiant Behaviors:   Oppositional/Defiant Behaviors: Argumentative  Borderline Personality:  Emotional Irregularity: N/A  Other Mood/Personality Symptoms:      Mental Status Exam Appearance and self-care  Stature:  Stature: Small  Weight:  Weight: Average weight  Clothing:  Clothing: Casual  Grooming:  Grooming: Normal  Cosmetic use:  Cosmetic Use: None  Posture/gait:  Posture/Gait: Normal  Motor activity:  Motor Activity: Not Remarkable  Sensorium  Attention:  Attention: Normal  Concentration:  Concentration: Normal  Orientation:  Orientation: X5  Recall/memory:  Recall/Memory: Normal  Affect and Mood  Affect:  Affect: Anxious  Mood:  Mood: Anxious, Irritable  Relating  Eye contact:  Eye Contact: Fleeting  Facial expression:  Facial Expression: Sad, Anxious  Attitude toward examiner:  Attitude Toward Examiner: Cooperative  Thought and Language  Speech flow: Speech Flow: Pressured  Thought content:  Thought Content: Appropriate to mood and circumstances  Preoccupation:     Hallucinations:     Organization:     Company secretaryxecutive Functions  Fund of Knowledge:  Fund of Knowledge: Average  Intelligence:  Intelligence: Average  Abstraction:     Judgement:  Judgement: Fair  Dance movement psychotherapisteality Testing:  Reality Testing: Adequate  Insight:  Insight: Fair  Decision Making:  Decision Making: Normal  Social Functioning  Social Maturity:  Social Maturity: Isolates  Social Judgement:  Social Judgement: Normal  Stress  Stressors:  Stressors: Arts administratorMoney, Work  Coping Ability:  Coping Ability: Building surveyorverwhelmed  Skill Deficits:     Supports:      Family and Psychosocial History: Family history Marital status: Married Number of Years Married: 20 What types of issues is patient dealing with in the relationship?: "We are a  pretty good team." Additional relationship information: They went to college together.  About 10 years ago they were having trouble in their marriage.  Considered ending the marriage.  Caused some trust issues.   He continues to think about that incident. Are you sexually active?: Yes Does patient have children?: Yes How many children?: 2 How is patient's relationship with their children?: Son, Matthew Marks (15) "He and I are pretty close."  Has ADHD  Goes to a charter school.  Plays lacrosse.  Interested in the [employer].         Son, Matthew Marks (11)- has a sensory disorder, some central processing issues, and ADHD  "I lose my patience with him."  Has anxiety and is emotionally sensitive  "He is fearful of me."  Childhood History:  Childhood History By whom was/is the patient raised?: Both parents Description of patient's relationship with caregiver when they were a child: Dad-"We never got along."  He was a Arts development officer.  Worked as a Medical illustrator so he was out of town a lot.  Mom-relationship was "fine" but notes she has a tendency to be very judgmental   Patient's description of current relationship with people who raised him/her: Parents are still together and live in [place].  He sees them 3-4 times a year. How were you disciplined when you got in trouble as a child/adolescent?: Minor spankings Does patient have siblings?: Yes Number of Siblings: 3 Description of patient's current relationship with siblings: Older brothers Matthew Marks (49)- They don't talk often.  Military-may have PTSD   Matthew Marks (47),-lives in [place], history of drinking problem, doing better now  Relationship has improved   and Matthew Marks (45)-pretty close growing up,  we drifted apart when he went to college  Reconnecting recently  Moving to [place]  Did patient suffer any verbal/emotional/physical/sexual abuse as a child?: No Did patient suffer from severe childhood neglect?: No Has patient ever been sexually abused/assaulted/raped as an adolescent or adult?: No Was the patient ever a victim of a crime or a disaster?: No Witnessed domestic violence?: No Has patient been effected by domestic violence as an adult?: No  CCA Part Two B  Employment/Work  Situation: Employment / Work Situation Employment situation: Employed Where is patient currently employed?: [employer] of [employer]  He processes claims.  Able to work at home most of the time. How long has patient been employed?: 9.5 years Patient's job has been impacted by current illness: No What is the longest time patient has a held a job?: 11 years Where was the patient employed at that time?: He was a Pension scheme manager. Has patient ever been in the [employer]?: No Are There Guns or Other Weapons in Your Home?: Yes Are These Weapons Safely Secured?: Yes  Education: Education Did Garment/textile technologist From [employer]?: Yes Did You Attend College?: Yes Did You Have Any Difficulty At School?:  (Some problems with comprehension, processing, and math) Were Any Medications Ever Prescribed For These Difficulties?: No  Religion: Religion/Spirituality Are You A Religious Person?: Yes (Used to attend church.) What is Your Religious Affiliation?: Chiropodist: Leisure / Recreation Leisure and Hobbies: Plays videogames with his kids, used to exercise more, used to do [employer]    Exercise/Diet: Exercise/Diet Do You Exercise?: Yes How Many Times a Week Do You Exercise?: 1-3 times a week Have You Gained or Lost A Significant Amount of Weight in the Past Six Months?: No Do You Follow a Special Diet?: No Do You Have Any Trouble Sleeping?: No  CCA Part Two C  Alcohol/Drug Use: Alcohol / Drug Use History of alcohol / drug use?: Yes Substance #1 Name of Substance 1: Alcohol 1 - Age of First Use: 6 1 - Amount (size/oz): 2-5 glasses of wine or beer 1 - Frequency: daily 1 - Last Use / Amount: Yesterday evening                    CCA Part Three  ASAM's:  Six Dimensions of Multidimensional Assessment  Dimension 1:  Acute Intoxication and/or Withdrawal Potential:     Dimension 2:  Biomedical Conditions and Complications:     Dimension 3:  Emotional,  Behavioral, or Cognitive Conditions and Complications:     Dimension 4:  Readiness to Change:     Dimension 5:  Relapse, Continued use, or Continued Problem Potential:     Dimension 6:  Recovery/Living Environment:      Substance use Disorder (SUD) Substance Use Disorder (SUD)  Checklist Symptoms of Substance Use: Presence of craving or strong urge to use  Social Function:  Social Functioning Social Maturity: Isolates Social Judgement: Normal  Stress:  Stress Stressors: Arts administrator, Work Coping Ability: Overwhelmed  Risk Assessment- Self-Harm Potential: Risk Assessment For Self-Harm Potential Thoughts of Self-Harm: No current thoughts Additional Comments for Self-Harm Potential: Denies history of harm to self  Risk Assessment -Dangerous to Others Potential: Risk Assessment For Dangerous to Others Potential Method: No Plan Additional Comments for Danger to Others Potential: Denies history of harm to others  DSM5 Diagnoses: Patient Active Problem List   Diagnosis Date Noted  . GAD (generalized anxiety disorder) 04/19/2016  . Poor concentration 10/13/2015  . Globus sensation 09/04/2014  . Lumbar subluxation 07/10/2014  . H/O chronic ear infection 07/04/2014  . Family history of prostate cancer 07/04/2014  . Bilateral plantar fasciitis 07/04/2014  . Presence of tympanostomy tube in right tympanic membrane 07/04/2014      Recommendations for Services/Supports/Treatments: Recommendations for Services/Supports/Treatments Recommendations For Services/Supports/Treatments: Individual Therapy   Patient has expressed that he would prefer to participate in therapy before considering taking medication.    Marilu Favre

## 2016-06-07 ENCOUNTER — Ambulatory Visit (INDEPENDENT_AMBULATORY_CARE_PROVIDER_SITE_OTHER): Payer: Federal, State, Local not specified - PPO | Admitting: Family Medicine

## 2016-06-07 ENCOUNTER — Ambulatory Visit (INDEPENDENT_AMBULATORY_CARE_PROVIDER_SITE_OTHER): Payer: Federal, State, Local not specified - PPO | Admitting: Licensed Clinical Social Worker

## 2016-06-07 ENCOUNTER — Encounter: Payer: Self-pay | Admitting: Family Medicine

## 2016-06-07 VITALS — BP 125/90 | HR 62 | Temp 98.1°F | Wt 169.0 lb

## 2016-06-07 DIAGNOSIS — F411 Generalized anxiety disorder: Secondary | ICD-10-CM

## 2016-06-07 DIAGNOSIS — L03011 Cellulitis of right finger: Secondary | ICD-10-CM | POA: Insufficient documentation

## 2016-06-07 DIAGNOSIS — B353 Tinea pedis: Secondary | ICD-10-CM | POA: Insufficient documentation

## 2016-06-07 DIAGNOSIS — F331 Major depressive disorder, recurrent, moderate: Secondary | ICD-10-CM | POA: Diagnosis not present

## 2016-06-07 MED ORDER — TERBINAFINE HCL 250 MG PO TABS: 250.0000 mg | ORAL_TABLET | Freq: Every day | ORAL | 1 refills | Status: AC

## 2016-06-07 MED ORDER — DOXYCYCLINE HYCLATE 100 MG PO TABS: 100.0000 mg | ORAL_TABLET | Freq: Two times a day (BID) | ORAL | 0 refills | Status: DC

## 2016-06-07 NOTE — Patient Instructions (Signed)
Thank you for coming in today. For finger take oral doxycycline twice daily for finger infection for 1 week.  For feet take oral terbinafine daily for 2 weeks.  Also use over the counter topical antifungal lamisil cream (terbinafine). Use this for 2 weeks.    Athlete's Foot Athlete's foot (tinea pedis) is a fungal infection of the skin on the feet. It often occurs on the skin that is between or underneath the toes. It can also occur on the soles of the feet. The infection can spread from person to person (is contagious). Follow these instructions at home:  Apply or take over-the-counter and prescription medicines only as told by your doctor.  Keep all follow-up visits as told by your doctor. This is important.  Do not scratch your feet.  Keep your feet dry:  Wear cotton or wool socks. Change your socks every day or if they become wet.  Wear shoes that allow air to move around, such as sandals or canvas tennis shoes.  Wash and dry your feet:  Every day or as told by your doctor.  After exercising.  Including the area between your toes.  Wear sandals in wet areas, such as locker rooms and shared showers.  Do not share any of these items:  Towels.  Nail clippers.  Other personal items that touch your feet.  If you have diabetes, keep your blood sugar under control. Contact a doctor if:  You have a fever.  You have swelling, soreness, warmth, or redness in your foot.  You are not getting better with treatment.  Your symptoms get worse.  You have new symptoms. This information is not intended to replace advice given to you by your health care provider. Make sure you discuss any questions you have with your health care provider. Document Released: 08/10/2007 Document Revised: 07/30/2015 Document Reviewed: 08/25/2014 Elsevier Interactive Patient Education  2017 [employer].

## 2016-06-07 NOTE — Progress Notes (Signed)
   THERAPIST PROGRESS NOTE  Session Time: 11:00am-11:55am  Participation Level: Active  Behavioral Response: CasualAlert Mostly euthymic  Type of Therapy: Individual Therapy  Treatment Goals addressed: Reduce negative thinking and improve communication of thoughts and feelings  Interventions: Treatment planning, psycho-ed about anxiety, identifying triggers  Suicidal/Homicidal: Denied both  Therapist Interventions: Collaborated with patient to develop his treatment plan.  Briefly described interventions he can expect as he participates in therapy. Reviewed the concept of fight or flight.  Explained that it is a response that is activated whenever you think there is a potential threat.  Noted this happens whether the threat is real or not.  Emphasized that the bodily responses are not dangerous.   Challenged patient to identify some of his triggers.   Summary:   Developed the following treatment goal: [name] will report a significant decrease in negative thinking and express his thoughts and feelings in a calm manner.  Already familiar with fight or flight.  Noted he tends to be defensive.  Was able to identify with some of the bodily reactions.  Denied ever having a full blown panic attack.   Talked about how he can be pretty judgmental of himself and others.  Indicated that he has a tendency to compare himself with others.  Also admitted to labeling himself and others.         Plan: Return again in approximately 2 weeks.  Diagnosis: Generalized Anxiety Disorder                         MDD recurrent, moderate    Matthew Marks 06/07/2016

## 2016-06-07 NOTE — Progress Notes (Signed)
Matthew Marks is a 41 y.o. male who presents to [name]: [facility] Medicine today for follow-up right index finger paronychia and discuss rash on feet.  Right index finger paronychia. Patient has had paronychia right index finger. This required incision and drainage previously. He notes he's always had some continued redness and swelling. This is worsened slightly recently. He notes that is not nearly as severe as it has been previously. He feels well with no fevers or chills.  Foot rash. Patient notes a rash on his feet bilaterally especially in the interdigital webspace. He notes that it is itchy. He's been using some sort of over-the-counter lotion that is not quite sure of which has helped some. He denies fevers or chills. He feels well otherwise. This lotion has helped a little.   Past Medical History:  Diagnosis Date  . H/O chronic ear infection 07/04/2014   Past Surgical History:  Procedure Laterality Date  . left knee surgery ligament tension    . TYMPANOSTOMY TUBE PLACEMENT     as a child and as an adult   Social History  Substance Use Topics  . Smoking status: Never Smoker  . Smokeless tobacco: Never Used  . Alcohol use 7.2 oz/week    12 Standard drinks or equivalent per week   family history includes Diabetes in his mother; Hypertension in his father; Prostate cancer in his father.  ROS as above:  Medications: Current Outpatient Prescriptions  Medication Sig Dispense Refill  . sildenafil (REVATIO) 20 MG tablet TAKE TWO TO FOUR TABLETS BY MOUTH ONLY AS NEEDED FOR SEX 50 tablet 0  . doxycycline (VIBRA-TABS) 100 MG tablet Take 1 tablet (100 mg total) by mouth 2 (two) times daily. 14 tablet 0  . terbinafine (LAMISIL) 250 MG tablet Take 1 tablet (250 mg total) by mouth daily. 14 tablet 1   No current facility-administered medications for this visit.    No Known  Allergies  Health Maintenance Health Maintenance  Topic Date Due  . INFLUENZA VACCINE  11/05/2016 (Originally 10/05/2016)  . TETANUS/TDAP  03/07/2020  . HIV Screening  Addressed     Exam:  BP 125/90   Pulse 62   Temp 98.1 F (36.7 C) (Oral)   Wt 169 lb (76.7 kg)   BMI 26.47 kg/m  Gen: Well NAD HEENT: EOMI,  MMM Lungs: Normal work of breathing. CTABL Heart: RRR no MRG Abd: NABS, Soft. Nondistended, Nontender Exts: Brisk capillary refill, warm and well perfused.  Feet bilaterally have a small mildly erythematous scaly rash interdigital webspace Right index finger is mildly swollen and erythematous at the distal phalanx. No fluctuance. Minimally tender. Capillary refill and sensation are intact   No results found for this or any previous visit (from the past 72 hour(s)). No results found.    Assessment and Plan: 41 y.o. male with  Paronychia: I'm not sure if this is just follow along inflammation from history of paronychia or to returning paronychia. Will try course of oral doxycycline.  Tinea pedis: Will treat with oral terbinafine as well as topical terbinafine. Recheck as needed.   No orders of the defined types were placed in this encounter.  Meds ordered this encounter  Medications  . doxycycline (VIBRA-TABS) 100 MG tablet    Sig: Take 1 tablet (100 mg total) by mouth 2 (two) times daily.    Dispense:  14 tablet    Refill:  0  . terbinafine (LAMISIL) 250 MG tablet  Sig: Take 1 tablet (250 mg total) by mouth daily.    Dispense:  14 tablet    Refill:  1     Discussed warning signs or symptoms. Please see discharge instructions. Patient expresses understanding.

## 2016-06-22 ENCOUNTER — Ambulatory Visit (HOSPITAL_COMMUNITY): Payer: Self-pay | Admitting: Licensed Clinical Social Worker

## 2016-07-14 ENCOUNTER — Ambulatory Visit (HOSPITAL_COMMUNITY): Payer: Self-pay | Admitting: Licensed Clinical Social Worker

## 2016-08-01 DIAGNOSIS — J019 Acute sinusitis, unspecified: Secondary | ICD-10-CM | POA: Diagnosis not present

## 2016-08-03 ENCOUNTER — Ambulatory Visit (INDEPENDENT_AMBULATORY_CARE_PROVIDER_SITE_OTHER): Payer: Federal, State, Local not specified - PPO | Admitting: Family Medicine

## 2016-08-03 ENCOUNTER — Ambulatory Visit (INDEPENDENT_AMBULATORY_CARE_PROVIDER_SITE_OTHER): Payer: Federal, State, Local not specified - PPO

## 2016-08-03 VITALS — BP 130/78 | HR 95 | Temp 98.6°F | Wt 164.0 lb

## 2016-08-03 DIAGNOSIS — R05 Cough: Secondary | ICD-10-CM | POA: Diagnosis not present

## 2016-08-03 DIAGNOSIS — J0100 Acute maxillary sinusitis, unspecified: Secondary | ICD-10-CM

## 2016-08-03 DIAGNOSIS — R059 Cough, unspecified: Secondary | ICD-10-CM

## 2016-08-03 MED ORDER — AZITHROMYCIN 250 MG PO TABS: 250.0000 mg | ORAL_TABLET | Freq: Every day | ORAL | 0 refills | Status: DC

## 2016-08-03 MED ORDER — PREDNISONE 10 MG PO TABS: 30.0000 mg | ORAL_TABLET | Freq: Every day | ORAL | 0 refills | Status: DC

## 2016-08-03 MED ORDER — HYDROCODONE-HOMATROPINE 5-1.5 MG/5ML PO SYRP: 5.0000 mL | ORAL_SOLUTION | Freq: Three times a day (TID) | ORAL | 0 refills | Status: DC | PRN

## 2016-08-03 MED ORDER — BENZONATATE 200 MG PO CAPS: 200.0000 mg | ORAL_CAPSULE | Freq: Three times a day (TID) | ORAL | 0 refills | Status: DC | PRN

## 2016-08-03 NOTE — Patient Instructions (Addendum)
Thank you for coming in today. Take over the counter zyrtec-D or allegra-D or claritin-D.  Take prednisone daily for 5 days.  Continue Augmentin.  Take tessalon during the day for cough.  Use hycodan cough suryp at night if coughing a lot.  You can take this medicine during the day but it may make you sleepy.   If not better in a few days take the backup azithromycin antibiotic.   Recheck if not better.   Call or go to the emergency room if you get worse, have trouble breathing, have chest pains, or palpitations.    Get a chest xray today.     Acute Bronchitis, Adult Acute bronchitis is sudden (acute) swelling of the air tubes (bronchi) in the lungs. Acute bronchitis causes these tubes to fill with mucus, which can make it hard to breathe. It can also cause coughing or wheezing. In adults, acute bronchitis usually goes away within 2 weeks. A cough caused by bronchitis may last up to 3 weeks. Smoking, allergies, and asthma can make the condition worse. Repeated episodes of bronchitis may cause further lung problems, such as chronic obstructive pulmonary disease (COPD). What are the causes? This condition can be caused by germs and by substances that irritate the lungs, including:  Cold and flu viruses. This condition is most often caused by the same virus that causes a cold.  Bacteria.  Exposure to tobacco smoke, dust, fumes, and air pollution. What increases the risk? This condition is more likely to develop in people who:  Have close contact with someone with acute bronchitis.  Are exposed to lung irritants, such as tobacco smoke, dust, fumes, and vapors.  Have a weak immune system.  Have a respiratory condition such as asthma. What are the signs or symptoms? Symptoms of this condition include:  A cough.  Coughing up clear, yellow, or green mucus.  Wheezing.  Chest congestion.  Shortness of breath.  A fever.  Body aches.  Chills.  A sore throat. How is  this diagnosed? This condition is usually diagnosed with a physical exam. During the exam, your health care provider may order tests, such as chest X-rays, to rule out other conditions. He or she may also:  Test a sample of your mucus for bacterial infection.  Check the level of oxygen in your blood. This is done to check for pneumonia.  Do a chest X-ray or lung function testing to rule out pneumonia and other conditions.  Perform blood tests. Your health care provider will also ask about your symptoms and medical history. How is this treated? Most cases of acute bronchitis clear up over time without treatment. Your health care provider may recommend:  Drinking more fluids. Drinking more makes your mucus thinner, which may make it easier to breathe.  Taking a medicine for a fever or cough.  Taking an antibiotic medicine.  Using an inhaler to help improve shortness of breath and to control a cough.  Using a cool mist vaporizer or humidifier to make it easier to breathe. Follow these instructions at home: Medicines   Take over-the-counter and prescription medicines only as told by your health care provider.  If you were prescribed an antibiotic, take it as told by your health care provider. Do not stop taking the antibiotic even if you start to feel better. General instructions   Get plenty of rest.  Drink enough fluids to keep your urine clear or pale yellow.  Avoid smoking and secondhand smoke. Exposure to cigarette smoke  or irritating chemicals will make bronchitis worse. If you smoke and you need help quitting, ask your health care provider. Quitting smoking will help your lungs heal faster.  Use an inhaler, cool mist vaporizer, or humidifier as told by your health care provider.  Keep all follow-up visits as told by your health care provider. This is important. How is this prevented? To lower your risk of getting this condition again:  Wash your hands often with soap and  water. If soap and water are not available, use hand sanitizer.  Avoid contact with people who have cold symptoms.  Try not to touch your hands to your mouth, nose, or eyes.  Make sure to get the flu shot every year. Contact a health care provider if:  Your symptoms do not improve in 2 weeks of treatment. Get help right away if:  You cough up blood.  You have chest pain.  You have severe shortness of breath.  You become dehydrated.  You faint or keep feeling like you are going to faint.  You keep vomiting.  You have a severe headache.  Your fever or chills gets worse. This information is not intended to replace advice given to you by your health care provider. Make sure you discuss any questions you have with your health care provider. Document Released: 03/31/2004 Document Revised: 09/16/2015 Document Reviewed: 08/12/2015 Elsevier Interactive Patient Education  2017 [employer].

## 2016-08-03 NOTE — Progress Notes (Signed)
Pt stated that sinus pain started with a cold 2 weeks ago.  He has used OTC nasal sprays, mucinex, and netty pot.  Seen in an UC Monday.  Has been on antibiotics x 3 days and not feeling any better. Has cough that keeps him up and gets SOB when coughing.

## 2016-08-03 NOTE — Progress Notes (Signed)
Matthew Marks is a 41 y.o. male who [facility] today for sore throat and nasal congestion and cough. Patient has had a cough for 2-3 weeks associated with a sore throat. He became acutely worse a few days ago and was seen in urgent care on May 28. He was diagnosed with acute sinusitis and prescribed Augmentin. He denies any ear drainage or ear congestion. He feels well with no vomiting or diarrhea. He notes the cough is incredibly bothersome and occurs with paroxysms of coughing. He denies any significant productive cough. He's tried multiple over-the-counter medications as well which has not helped.   Past Medical History:  Diagnosis Date  . H/O chronic ear infection 07/04/2014   Past Surgical History:  Procedure Laterality Date  . left knee surgery ligament tension    . TYMPANOSTOMY TUBE PLACEMENT     as a child and as an adult   Social History  Substance Use Topics  . Smoking status: Never Smoker  . Smokeless tobacco: Never Used  . Alcohol use 7.2 oz/week    12 Standard drinks or equivalent per week   family history includes Diabetes in his mother; Hypertension in his father; Prostate cancer in his father.  ROS as above:  Medications: Current Outpatient Prescriptions  Medication Sig Dispense Refill  . azithromycin (ZITHROMAX) 250 MG tablet Take 1 tablet (250 mg total) by mouth daily. Take first 2 tablets together, then 1 every day until finished. 6 tablet 0  . benzonatate (TESSALON) 200 MG capsule Take 1 capsule (200 mg total) by mouth 3 (three) times daily as needed for cough. 45 capsule 0  . HYDROcodone-homatropine (HYCODAN) 5-1.5 MG/5ML syrup Take 5 mLs by mouth every 8 (eight) hours as needed for cough. 180 mL 0  . predniSONE (DELTASONE) 10 MG tablet Take 3 tablets (30 mg total) by mouth daily with breakfast. 15 tablet 0  . sildenafil (REVATIO)  20 MG tablet TAKE TWO TO FOUR TABLETS BY MOUTH ONLY AS NEEDED FOR SEX 50 tablet 0   No current facility-administered medications for this visit.    No Known Allergies  Health Maintenance Health Maintenance  Topic Date Due  . INFLUENZA VACCINE  11/05/2016 (Originally 10/05/2016)  . TETANUS/TDAP  03/07/2020  . HIV Screening  Completed     Exam:  BP 130/78 (BP Location: Left Arm, Patient Position: Sitting, Cuff Size: Normal)   Pulse 95   Temp 98.6 F (37 C) (Oral)   Wt 164 lb (74.4 kg)   SpO2 99%   BMI 25.69 kg/m   Gen: Well NAD HEENT: EOMI,  MMM clear nasal discharge. Inflamed nasal turbinates bilaterally. Mildly tender to palpation maxillary sinuses bilaterally. Posterior pharynx with cobblestoning. Normal left tympanic membrane. Right tympanic membranes with intact TM tube with no discharge. No significant cervical lymphadenopathy Lungs: Normal work of breathing. CTABL frequent coughing Heart: RRR no MRG Abd: NABS, Soft. Nondistended, Nontender Exts: Brisk capillary refill, warm and well perfused.    No results found for this or any previous visit (from the past 72 hour(s)). Dg Chest 2 View  Result Date: 08/03/2016 CLINICAL DATA:  Cough. Assess for intrathoracic pathology. Runny nose for 2 weeks. EXAM: CHEST  2 VIEW COMPARISON:  None. FINDINGS: The heart size and mediastinal contours are within normal limits. Both lungs are clear. The visualized skeletal structures are unremarkable. IMPRESSION: Negative chest. Electronically Signed   By: Marnee SpringJonathon  Watts M.D.   On: 08/03/2016 09:59  Assessment and Plan: 41 y.o. male with cough likely due to allergies or viral URI with secondary sickening. Patient has now developed what sounds like acute sinusitis or potential bronchitis. I think Augmentin was reasonable but clearly is not fully effective. We'll broaden to include prednisone as well as Tessalon and Hycodan cough syrup.  We use backup azithromycin if not better. Chest x-ray  normal today.  Patient was researched in the [place] controlled substance reporting system   Orders Placed This Encounter  Procedures  . DG Chest 2 View    Order Specific Question:   Reason for exam:    Answer:   Cough, assess intra-thoracic pathology    Order Specific Question:   Preferred imaging location?    Answer:   [facility]   Meds ordered this encounter  Medications  . predniSONE (DELTASONE) 10 MG tablet    Sig: Take 3 tablets (30 mg total) by mouth daily with breakfast.    Dispense:  15 tablet    Refill:  0  . benzonatate (TESSALON) 200 MG capsule    Sig: Take 1 capsule (200 mg total) by mouth 3 (three) times daily as needed for cough.    Dispense:  45 capsule    Refill:  0  . HYDROcodone-homatropine (HYCODAN) 5-1.5 MG/5ML syrup    Sig: Take 5 mLs by mouth every 8 (eight) hours as needed for cough.    Dispense:  180 mL    Refill:  0  . azithromycin (ZITHROMAX) 250 MG tablet    Sig: Take 1 tablet (250 mg total) by mouth daily. Take first 2 tablets together, then 1 every day until finished.    Dispense:  6 tablet    Refill:  0     Discussed warning signs or symptoms. Please see discharge instructions. Patient expresses understanding.

## 2016-08-04 ENCOUNTER — Ambulatory Visit (HOSPITAL_COMMUNITY): Payer: Self-pay | Admitting: Licensed Clinical Social Worker

## 2016-08-09 ENCOUNTER — Encounter: Payer: Self-pay | Admitting: Family Medicine

## 2016-08-09 ENCOUNTER — Ambulatory Visit (INDEPENDENT_AMBULATORY_CARE_PROVIDER_SITE_OTHER): Payer: Federal, State, Local not specified - PPO | Admitting: Family Medicine

## 2016-08-09 VITALS — BP 125/83 | HR 74 | Temp 97.7°F | Wt 161.0 lb

## 2016-08-09 DIAGNOSIS — R05 Cough: Secondary | ICD-10-CM | POA: Diagnosis not present

## 2016-08-09 DIAGNOSIS — R062 Wheezing: Secondary | ICD-10-CM

## 2016-08-09 DIAGNOSIS — R059 Cough, unspecified: Secondary | ICD-10-CM

## 2016-08-09 MED ORDER — ALBUTEROL SULFATE HFA 108 (90 BASE) MCG/ACT IN AERS: 2.0000 | INHALATION_SPRAY | Freq: Four times a day (QID) | RESPIRATORY_TRACT | 0 refills | Status: DC | PRN

## 2016-08-09 MED ORDER — IPRATROPIUM-ALBUTEROL 0.5-2.5 (3) MG/3ML IN SOLN: 3.0000 mL | Freq: Four times a day (QID) | RESPIRATORY_TRACT | Status: DC

## 2016-08-09 MED ORDER — IPRATROPIUM-ALBUTEROL 0.5-2.5 (3) MG/3ML IN SOLN
3.0000 mL | Freq: Once | RESPIRATORY_TRACT | Status: AC
  Administered 2016-08-09: 3 mL via RESPIRATORY_TRACT

## 2016-08-09 MED ORDER — PREDNISONE 5 MG (48) PO TBPK: ORAL_TABLET | ORAL | 0 refills | Status: DC

## 2016-08-09 NOTE — Progress Notes (Signed)
Matthew Marks is a 41 y.o. male who presents to [name] today for cough congestion runny nose. Patient was seen last week for cough and congestion and wheezing. He was prescribed azithromycin and Tessalon Perles and codeine cough medicine. Additionally he was given prednisone. He already had been prescribed Augmentin. He completed a five-day course of prednisone which helped a little. The cough medicine's help a little as well. He has not started taking the azithromycin. He notes continued cough and some wheezing. He denies significant shortness of breath. Overall he thinks he is doing a little bit better.   Past Medical History:  Diagnosis Date  . H/O chronic ear infection 07/04/2014   Past Surgical History:  Procedure Laterality Date  . left knee surgery ligament tension    . TYMPANOSTOMY TUBE PLACEMENT     as a child and as an adult   Social History  Substance Use Topics  . Smoking status: Never Smoker  . Smokeless tobacco: Never Used  . Alcohol use 7.2 oz/week    12 Standard drinks or equivalent per week   family history includes Diabetes in his mother; Hypertension in his father; Prostate cancer in his father.  ROS as above:  Medications: Current Outpatient Prescriptions  Medication Sig Dispense Refill  . amoxicillin-clavulanate (AUGMENTIN) 875-125 MG tablet Take by mouth.    Marland Kitchen HYDROcodone-homatropine (HYCODAN) 5-1.5 MG/5ML syrup Take 5 mLs by mouth every 8 (eight) hours as needed for cough. 180 mL 0  . albuterol (PROVENTIL HFA;VENTOLIN HFA) 108 (90 Base) MCG/ACT inhaler Inhale 2 puffs into the lungs every 6 (six) hours as needed for wheezing or shortness of breath. 1 Inhaler 0  . azithromycin (ZITHROMAX) 250 MG tablet Take 1 tablet (250 mg total) by mouth daily. Take first 2 tablets together, then 1 every day until finished. (Patient not taking: Reported on  08/09/2016) 6 tablet 0  . benzonatate (TESSALON) 200 MG capsule Take 1 capsule (200 mg total) by mouth 3 (three) times daily as needed for cough. (Patient not taking: Reported on 08/09/2016) 45 capsule 0  . predniSONE (STERAPRED UNI-PAK 48 TAB) 5 MG (48) TBPK tablet 12 day dosepack po 48 tablet 0  . sildenafil (REVATIO) 20 MG tablet TAKE TWO TO FOUR TABLETS BY MOUTH ONLY AS NEEDED FOR SEX (Patient not taking: Reported on 08/09/2016) 50 tablet 0   No current facility-administered medications for this visit.    No Known Allergies  Health Maintenance Health Maintenance  Topic Date Due  . INFLUENZA VACCINE  11/05/2016 (Originally 10/05/2016)  . TETANUS/TDAP  03/07/2020  . HIV Screening  Completed     Exam:  BP 125/83   Pulse 74   Temp 97.7 F (36.5 C) (Oral)   Wt 161 lb (73 kg)   BMI 25.22 kg/m  Gen: Well NAD HEENT: EOMI,  MMM Right tympanic membrane with intact tube left partially retracted. Posterior pharynx with cobblestoning clear nasal discharge. Lungs: Normal work of breathing. Coarse breath sound and mild wheezing present bilaterally Heart: RRR no MRG Abd: NABS, Soft. Nondistended, Nontender Exts: Brisk capillary refill, warm and well perfused.   She was given a 2.5/0.5 mg DuoNeb nebulizer treatment and had improvement in symptoms.  No results found for this or any previous visit (from the past 72 hour(s)). No results found.    Assessment and Plan: 41 y.o. male with postviral cough with resolving bronchitis. Plan to use albuterol inhaler. Prescribe albuterol. Additionally prescribed back of  prednisone to use if not better. Recommend patient start taking the azithromycin.   No orders of the defined types were placed in this encounter.  Meds ordered this encounter  Medications  . amoxicillin-clavulanate (AUGMENTIN) 875-125 MG tablet    Sig: Take by mouth.  . DISCONTD: ipratropium-albuterol (DUONEB) 0.5-2.5 (3) MG/3ML nebulizer solution 3 mL  . ipratropium-albuterol  (DUONEB) 0.5-2.5 (3) MG/3ML nebulizer solution 3 mL  . predniSONE (STERAPRED UNI-PAK 48 TAB) 5 MG (48) TBPK tablet    Sig: 12 day dosepack po    Dispense:  48 tablet    Refill:  0  . albuterol (PROVENTIL HFA;VENTOLIN HFA) 108 (90 Base) MCG/ACT inhaler    Sig: Inhale 2 puffs into the lungs every 6 (six) hours as needed for wheezing or shortness of breath.    Dispense:  1 Inhaler    Refill:  0     Discussed warning signs or symptoms. Please see discharge instructions. Patient expresses understanding.

## 2016-08-09 NOTE — Patient Instructions (Signed)
Thank you for coming in today. Use the albuterol inhaler as needed.  Take azithromycin.  If not better take the longer course of prednisone I printed.  You may cough for a few weeks.  Recheck as needed.   Call or go to the emergency room if you get worse, have trouble breathing, have chest pains, or palpitations.    Acute Bronchitis, Adult Acute bronchitis is sudden (acute) swelling of the air tubes (bronchi) in the lungs. Acute bronchitis causes these tubes to fill with mucus, which can make it hard to breathe. It can also cause coughing or wheezing. In adults, acute bronchitis usually goes away within 2 weeks. A cough caused by bronchitis may last up to 3 weeks. Smoking, allergies, and asthma can make the condition worse. Repeated episodes of bronchitis may cause further lung problems, such as chronic obstructive pulmonary disease (COPD). What are the causes? This condition can be caused by germs and by substances that irritate the lungs, including:  Cold and flu viruses. This condition is most often caused by the same virus that causes a cold.  Bacteria.  Exposure to tobacco smoke, dust, fumes, and air pollution.  What increases the risk? This condition is more likely to develop in people who:  Have close contact with someone with acute bronchitis.  Are exposed to lung irritants, such as tobacco smoke, dust, fumes, and vapors.  Have a weak immune system.  Have a respiratory condition such as asthma.  What are the signs or symptoms? Symptoms of this condition include:  A cough.  Coughing up clear, yellow, or green mucus.  Wheezing.  Chest congestion.  Shortness of breath.  A fever.  Body aches.  Chills.  A sore throat.  How is this diagnosed? This condition is usually diagnosed with a physical exam. During the exam, your health care provider may order tests, such as chest X-rays, to rule out other conditions. He or she may also:  Test a sample of your mucus  for bacterial infection.  Check the level of oxygen in your blood. This is done to check for pneumonia.  Do a chest X-ray or lung function testing to rule out pneumonia and other conditions.  Perform blood tests.  Your health care provider will also ask about your symptoms and medical history. How is this treated? Most cases of acute bronchitis clear up over time without treatment. Your health care provider may recommend:  Drinking more fluids. Drinking more makes your mucus thinner, which may make it easier to breathe.  Taking a medicine for a fever or cough.  Taking an antibiotic medicine.  Using an inhaler to help improve shortness of breath and to control a cough.  Using a cool mist vaporizer or humidifier to make it easier to breathe.  Follow these instructions at home: Medicines  Take over-the-counter and prescription medicines only as told by your health care provider.  If you were prescribed an antibiotic, take it as told by your health care provider. Do not stop taking the antibiotic even if you start to feel better. General instructions  Get plenty of rest.  Drink enough fluids to keep your urine clear or pale yellow.  Avoid smoking and secondhand smoke. Exposure to cigarette smoke or irritating chemicals will make bronchitis worse. If you smoke and you need help quitting, ask your health care provider. Quitting smoking will help your lungs heal faster.  Use an inhaler, cool mist vaporizer, or humidifier as told by your health care provider.  Keep  all follow-up visits as told by your health care provider. This is important. How is this prevented? To lower your risk of getting this condition again:  Wash your hands often with soap and water. If soap and water are not available, use hand sanitizer.  Avoid contact with people who have cold symptoms.  Try not to touch your hands to your mouth, nose, or eyes.  Make sure to get the flu shot every year.  Contact a  health care provider if:  Your symptoms do not improve in 2 weeks of treatment. Get help right away if:  You cough up blood.  You have chest pain.  You have severe shortness of breath.  You become dehydrated.  You faint or keep feeling like you are going to faint.  You keep vomiting.  You have a severe headache.  Your fever or chills gets worse. This information is not intended to replace advice given to you by your health care provider. Make sure you discuss any questions you have with your health care provider. Document Released: 03/31/2004 Document Revised: 09/16/2015 Document Reviewed: 08/12/2015 Elsevier Interactive Patient Education  2017 [employer]

## 2016-09-13 DIAGNOSIS — K080 Exfoliation of teeth due to systemic causes: Secondary | ICD-10-CM | POA: Diagnosis not present

## 2016-09-15 ENCOUNTER — Encounter: Payer: Self-pay | Admitting: Family Medicine

## 2016-09-15 ENCOUNTER — Ambulatory Visit (INDEPENDENT_AMBULATORY_CARE_PROVIDER_SITE_OTHER): Payer: Federal, State, Local not specified - PPO | Admitting: Family Medicine

## 2016-09-15 VITALS — BP 136/76 | HR 66 | Temp 98.6°F | Wt 166.0 lb

## 2016-09-15 DIAGNOSIS — J329 Chronic sinusitis, unspecified: Secondary | ICD-10-CM | POA: Diagnosis not present

## 2016-09-15 DIAGNOSIS — R0602 Shortness of breath: Secondary | ICD-10-CM | POA: Diagnosis not present

## 2016-09-15 MED ORDER — PREDNISONE 10 MG PO TABS: 30.0000 mg | ORAL_TABLET | Freq: Every day | ORAL | 0 refills | Status: DC

## 2016-09-15 MED ORDER — BUDESONIDE-FORMOTEROL FUMARATE 80-4.5 MCG/ACT IN AERO: 1.0000 | INHALATION_SPRAY | Freq: Two times a day (BID) | RESPIRATORY_TRACT | 0 refills | Status: DC

## 2016-09-15 MED ORDER — CEFDINIR 300 MG PO CAPS: 300.0000 mg | ORAL_CAPSULE | Freq: Two times a day (BID) | ORAL | 0 refills | Status: DC

## 2016-09-15 NOTE — Patient Instructions (Signed)
Thank you for coming in today. Try using the Symbicort inhailler twice daily (1 puff) Use a;buterol only as needed.  Take prednisone and omnicef if worse.   Take over the counter Allegra daily.  Use flonase nasal spray.   Return if not better.   Call or go to the emergency room if you get worse, have trouble breathing, have chest pains, or palpitations.

## 2016-09-15 NOTE — Progress Notes (Signed)
Matthew HoveKevin Marks is a 41 y.o. male who [facility] today for cough and congestion. Patient notes resolving cough and congestion. He's been seen several times of the past several months for these symptoms. He notes he is feeling about 90% better compared to where he was about a month ago but notes slight returning sinus pain and pressure and congestion is worried that he will get sick. He is leaving for vacation later this week and is worried that he'll be sick on vacation. He does note some continued mild shortness of breath especially with exertion. This is associated with wheezing. He notes the albuterol does help his symptoms.   Past Medical History:  Diagnosis Date  . H/O chronic ear infection 07/04/2014   Past Surgical History:  Procedure Laterality Date  . left knee surgery ligament tension    . TYMPANOSTOMY TUBE PLACEMENT     as a child and as an adult   Social History  Substance Use Topics  . Smoking status: Never Smoker  . Smokeless tobacco: Never Used  . Alcohol use 7.2 oz/week    12 Standard drinks or equivalent per week   family history includes Diabetes in his mother; Hypertension in his father; Prostate cancer in his father.  ROS as above:  Medications: Current Outpatient Prescriptions  Medication Sig Dispense Refill  . albuterol (PROVENTIL HFA;VENTOLIN HFA) 108 (90 Base) MCG/ACT inhaler Inhale 2 puffs into the lungs every 6 (six) hours as needed for wheezing or shortness of breath. 1 Inhaler 0  . sildenafil (REVATIO) 20 MG tablet TAKE TWO TO FOUR TABLETS BY MOUTH ONLY AS NEEDED FOR SEX 50 tablet 0  . budesonide-formoterol (SYMBICORT) 80-4.5 MCG/ACT inhaler Inhale 1 puff into the lungs 2 (two) times daily. 1 Inhaler 0  . cefdinir (OMNICEF) 300 MG capsule Take 1 capsule (300 mg total) by mouth 2 (two) times daily. 14 capsule 0  . predniSONE  (DELTASONE) 10 MG tablet Take 3 tablets (30 mg total) by mouth daily with breakfast. 15 tablet 0   No current facility-administered medications for this visit.    No Known Allergies  Health Maintenance Health Maintenance  Topic Date Due  . INFLUENZA VACCINE  11/05/2016 (Originally 10/05/2016)  . TETANUS/TDAP  03/07/2020  . HIV Screening  Completed     Exam:  BP 136/76   Pulse 66   Temp 98.6 F (37 C) (Oral)   Wt 166 lb (75.3 kg)   SpO2 99%   BMI 26.00 kg/m  Gen: Well NAD HEENT: EOMI,  MMM Right tympanic membrane with intact tube. No drainage. Left normal. Maxillary and frontal sinuses are nontender. Clear nasal discharge. Lungs: Normal work of breathing. CTABL Heart: RRR no MRG Abd: NABS, Soft. Nondistended, Nontender Exts: Brisk capillary refill, warm and well perfused.    No results found for this or any previous visit (from the past 72 hour(s)). No results found.    Assessment and Plan: 41 y.o. male with viral URI. Plan for watchful waiting. We'll use backup prednisone and Omnicef if symptoms worsen while on vacation. Additionally we'll do a trial of Symbicort for a month to see if that improves his respiratory symptoms. Sample given.   No orders of the defined types were placed in this encounter.  Meds ordered this encounter  Medications  . budesonide-formoterol (SYMBICORT) 80-4.5 MCG/ACT inhaler    Sig: Inhale 1 puff into the lungs 2 (two) times daily.    Dispense:  1 Inhaler    Refill:  0  . predniSONE (DELTASONE) 10 MG tablet    Sig: Take 3 tablets (30 mg total) by mouth daily with breakfast.    Dispense:  15 tablet    Refill:  0  . cefdinir (OMNICEF) 300 MG capsule    Sig: Take 1 capsule (300 mg total) by mouth 2 (two) times daily.    Dispense:  14 capsule    Refill:  0     Discussed warning signs or symptoms. Please see discharge instructions. Patient expresses understanding.

## 2016-10-25 ENCOUNTER — Other Ambulatory Visit: Payer: Self-pay

## 2016-10-25 DIAGNOSIS — N529 Male erectile dysfunction, unspecified: Secondary | ICD-10-CM

## 2016-10-25 MED ORDER — SILDENAFIL CITRATE 20 MG PO TABS: ORAL_TABLET | ORAL | 0 refills | Status: DC

## 2016-12-28 ENCOUNTER — Telehealth: Payer: Self-pay | Admitting: Family Medicine

## 2016-12-28 DIAGNOSIS — N529 Male erectile dysfunction, unspecified: Secondary | ICD-10-CM

## 2016-12-28 MED ORDER — SILDENAFIL CITRATE 20 MG PO TABS: 40.0000 mg | ORAL_TABLET | ORAL | 12 refills | Status: DC | PRN

## 2016-12-28 NOTE — Telephone Encounter (Signed)
Viagra refilled

## 2017-03-09 DIAGNOSIS — B079 Viral wart, unspecified: Secondary | ICD-10-CM | POA: Diagnosis not present

## 2017-03-09 DIAGNOSIS — L720 Epidermal cyst: Secondary | ICD-10-CM | POA: Diagnosis not present

## 2017-04-10 ENCOUNTER — Encounter: Payer: Self-pay | Admitting: Family Medicine

## 2017-04-10 ENCOUNTER — Ambulatory Visit (INDEPENDENT_AMBULATORY_CARE_PROVIDER_SITE_OTHER): Payer: Federal, State, Local not specified - PPO | Admitting: Family Medicine

## 2017-04-10 VITALS — BP 135/89 | HR 82 | Ht 67.0 in | Wt 167.0 lb

## 2017-04-10 DIAGNOSIS — Z7289 Other problems related to lifestyle: Secondary | ICD-10-CM

## 2017-04-10 DIAGNOSIS — F1090 Alcohol use, unspecified, uncomplicated: Secondary | ICD-10-CM

## 2017-04-10 DIAGNOSIS — R5383 Other fatigue: Secondary | ICD-10-CM

## 2017-04-10 DIAGNOSIS — Z0000 Encounter for general adult medical examination without abnormal findings: Secondary | ICD-10-CM | POA: Diagnosis not present

## 2017-04-10 DIAGNOSIS — F411 Generalized anxiety disorder: Secondary | ICD-10-CM

## 2017-04-10 DIAGNOSIS — F331 Major depressive disorder, recurrent, moderate: Secondary | ICD-10-CM | POA: Diagnosis not present

## 2017-04-10 NOTE — Progress Notes (Signed)
Matthew Marks is a 42 y.o. male who presents to [name]: Primary Care [facility] Medicine today for wellness visit.   Patient with history of anxiety, depression, and mood disorder. Patient endorsing symptoms currently with PHQ9 = 9. Patient endorsing poor interest in activities he used to enjoy. Also endorsing stress at home and at work. Patient does state that he has struggled with mood and irritation for several years at this time. He did go to behavioral health 2 or 3 times several months ago and it was helpful. He is resistant to medication and had a poor experience with Zoloft in the past. Patient using CBD oil to help with anxiety, which he thinks has helped somewhat.  Patient also endorsing increased alcohol use. He is drinking 5-6 beers/glasses of wine per night each night of the week. He is aware this is too much and is drinking to cope with anxiety and irritation. He is amenable to cutting back and has had success cutting back in the past. He set goal of cutting back to 2-3 drinks per night.   Health maintenance: patient will set goal to exercise more. He used to enjoy exercise but feels like motivation to do so. Also struggles with running injuries. Amenable to trying cycling.   Patient needs labs, as they have not been drawn in over a year.   Past Medical History:  Diagnosis Date  . H/O chronic ear infection 07/04/2014   Past Surgical History:  Procedure Laterality Date  . left knee surgery ligament tension    . TYMPANOSTOMY TUBE PLACEMENT     as a child and as an adult   Social History   Tobacco Use  . Smoking status: Never Smoker  . Smokeless tobacco: Never Used  Substance Use Topics  . Alcohol use: Yes    Alcohol/week: 7.2 oz    Types: 12 Standard drinks or equivalent per week   family history includes Diabetes in his mother; Hypertension in his father; Prostate cancer in his  father.  ROS as above:  Medications: Current Outpatient Medications  Medication Sig Dispense Refill  . sildenafil (REVATIO) 20 MG tablet Take 2-5 tablets (40-100 mg total) by mouth as needed (sex). 150 tablet 12   No current facility-administered medications for this visit.    No Known Allergies  Health Maintenance Health Maintenance  Topic Date Due  . INFLUENZA VACCINE  12/11/2017 (Originally 10/05/2016)  . TETANUS/TDAP  03/07/2020  . HIV Screening  Completed     Exam:  BP 135/89   Pulse 82   Ht 5\' 7"  (1.702 m)   Wt 167 lb (75.8 kg)   BMI 26.16 kg/m  Gen: Well NAD HEENT: EOMI,  MMM Lungs: Normal work of breathing. CTABL Heart: RRR no MRG Abd: NABS, Soft. Nondistended, Nontender Exts: Brisk capillary refill, warm and well perfused.  Psych: depressed mood and affect. Conversational. Appropriate thought content and process.   Depression screen [facility] 2/9 04/10/2017 04/19/2016  Decreased Interest 1 1  Down, Depressed, Hopeless 2 1  PHQ - 2 Score 3 2  Altered sleeping 0 0  Tired, decreased energy 2 2  Change in appetite 1 2  Feeling bad or failure about yourself  2 1  Trouble concentrating 0 2  Moving slowly or fidgety/restless 1 0  Suicidal thoughts 0 0  PHQ-9 Score 9 9  Difficult doing work/chores Somewhat difficult -  Some encounter information is confidential and restricted. Go to Review Flowsheets activity to see  all data.     No results found for this or any previous visit (from the past 72 hour(s)). No results found.    Assessment and Plan: 42 y.o. male with likely MDD and GAD. Patient will benefit from behavioral health. He is amenable to attending again. Patient resistant to medications at this point, so we provided reading material. Patient does not appear to be at risk for harming self or others at this time.   Alcohol use: Set goal to cut back to 2 drinks per night. Patient is still functioning and work, but is aware that he is drinking too much. More  importantly, he is coping with anxiety by drinking, so we encouraged better coping stategies. Patient aware of social, mental, and physical benefits of cutting back on drinking.   Health maintenance: ordered baseline labs as well as depression labs, including TSH, testosterone, CBC, CMP, and lipid panel.   Patient should attend behavorial therapy and return to follow up in 2 months. He should return earlier if he has questions or if he is worsening in terms of depression/anxiety.    Orders Placed This Encounter  Procedures  . TSH  . Testosterone  . CBC  . COMPLETE METABOLIC PANEL WITH GFR  . Lipid Panel w/reflex Direct LDL  . Ambulatory referral to Behavioral Health    Referral Priority:   Routine    Referral Type:   Psychiatric    Referral Reason:   Specialty Services Required    Requested Specialty:   Behavioral Health    Number of Visits Requested:   1   No orders of the defined types were placed in this encounter.    Discussed warning signs or symptoms. Please see discharge instructions. Patient expresses understanding.

## 2017-04-10 NOTE — Patient Instructions (Addendum)
Thank you for coming in today.  You should hear about therapy appts.   Get labs in the near future.  Recheck with me in 2 months.   Work on cutting back or quitting alcohol.  Return sooner if needed.   Research Trintellix.   Vortioxetine oral tablet What is this medicine? Vortioxetine (vor tee [employer] e teen) is used to treat depression. This medicine may be used for other purposes; ask your health care provider or pharmacist if you have questions. COMMON BRAND NAME(S): BRINTELLIX, Trintellix What should I tell my health care provider before I take this medicine? They need to know if you have any of these conditions: -bipolar disorder or a family history of bipolar disorder -bleeding disorders -drink alcohol -glaucoma -liver disease -low levels of sodium in the blood -seizures -suicidal thoughts, plans, or attempt; a previous suicide attempt by you or a family member -take medicines that treat or prevent blood clots -an unusual or allergic reaction to vortioxetine, other medicines, foods, dyes, or preservatives -pregnant or trying to get pregnant -breast-feeding How should I use this medicine? Take this medicine by mouth with a glass of water. Follow the directions on the prescription label. You can take it with or without food. If it upsets your stomach, take it with food. Take your medicine at regular intervals. Do not take it more often than directed. Do not stop taking this medicine suddenly except upon the advice of your doctor. Stopping this medicine too quickly may cause serious side effects or your condition may worsen. A special MedGuide will be given to you by the pharmacist with each prescription and refill. Be sure to read this information carefully each time. Talk to your pediatrician regarding the use of this medicine in children. Special care may be needed. Overdosage: If you think you have taken too much of this medicine contact a poison control center or emergency room  at once. NOTE: This medicine is only for you. Do not share this medicine with others. What if I miss a dose? If you miss a dose, take it as soon as you can. If it is almost time for your next dose, take only that dose. Do not take double or extra doses. What may interact with this medicine? Do not take this medicine with any of the following medications: -linezolid -MAOIs like Carbex, Eldepryl, Marplan, Nardil, and Parnate -methylene blue (injected into a vein) This medicine may also interact with the following medications: -alcohol -aspirin and aspirin-like medicines -carbamazepine -certain medicines for depression, anxiety, or psychotic disturbances -certain medicines for migraine headache like almotriptan, eletriptan, frovatriptan, naratriptan, rizatriptan, sumatriptan, zolmitriptan -diuretics -fentanyl -furazolidone -isoniazid -medicines that treat or prevent blood clots like warfarin, enoxaparin, and dalteparin -NSAIDs, medicines for pain and inflammation, like ibuprofen or naproxen -phenytoin -procarbazine -quinidine -rasagiline -rifampin -supplements like St. John's wort, kava kava, valerian -tramadol -tryptophan This list may not describe all possible interactions. Give your health care provider a list of all the medicines, herbs, non-prescription drugs, or dietary supplements you use. Also tell them if you smoke, drink alcohol, or use illegal drugs. Some items may interact with your medicine. What should I watch for while using this medicine? Tell your doctor if your symptoms do not get better or if they get worse. Visit your doctor or health care professional for regular checks on your progress. Because it may take several weeks to see the full effects of this medicine, it is important to continue your treatment as prescribed by your doctor. Patients  and their families should watch out for new or worsening thoughts of suicide or depression. Also watch out for sudden changes  in feelings such as feeling anxious, agitated, panicky, irritable, hostile, aggressive, impulsive, severely restless, overly excited and hyperactive, or not being able to sleep. If this happens, especially at the beginning of treatment or after a change in dose, call your health care professional. Bonita QuinYou may get drowsy or dizzy. Do not drive, use machinery, or do anything that needs mental alertness until you know how this medicine affects you. Do not stand or sit up quickly, especially if you are an older patient. This reduces the risk of dizzy or fainting spells. Alcohol may interfere with the effect of this medicine. Avoid alcoholic drinks. Your mouth may get dry. Chewing sugarless gum or sucking hard candy, and drinking plenty of water may help. Contact your doctor if the problem does not go away or is severe. What side effects may I notice from receiving this medicine? Side effects that you should report to your doctor or health care professional as soon as possible: -allergic reactions like skin rash, itching or hives, swelling of the face, lips, or tongue -anxious -black, tarry stools -changes in vision -confusion -elevated mood, decreased need for sleep, racing thoughts, impulsive behavior -eye pain -fast, irregular heartbeat -feeling faint or lightheaded, falls -feeling agitated, angry, or irritable -hallucination, loss of contact with reality -loss of balance or coordination -loss of memory -painful or prolonged erections -restlessness, pacing, inability to keep still -seizures -stiff muscles -suicidal thoughts or other mood changes -trouble sleeping -unusual bleeding or bruising -unusually weak or tired -vomiting Side effects that usually do not require medical attention (report to your doctor or health care professional if they continue or are bothersome): -change in appetite or weight -change in sex drive or performance -constipation -dizziness -dry mouth -nausea This list  may not describe all possible side effects. Call your doctor for medical advice about side effects. You may report side effects to FDA at 1-800-FDA-1088. Where should I keep my medicine? Keep out of the reach of children. Store at room temperature between 15 and 30 degrees C (59 and 86 degrees F). Throw away any unused medicine after the expiration date. NOTE: This sheet is a summary. It may not cover all possible information. If you have questions about this medicine, talk to your doctor, pharmacist, or health care provider.  2018 Elsevier/Gold Standard (2015-07-23 16:45:13)

## 2017-04-13 DIAGNOSIS — L720 Epidermal cyst: Secondary | ICD-10-CM | POA: Diagnosis not present

## 2017-04-28 DIAGNOSIS — F411 Generalized anxiety disorder: Secondary | ICD-10-CM | POA: Diagnosis not present

## 2017-04-28 DIAGNOSIS — F331 Major depressive disorder, recurrent, moderate: Secondary | ICD-10-CM | POA: Diagnosis not present

## 2017-04-28 DIAGNOSIS — Z7289 Other problems related to lifestyle: Secondary | ICD-10-CM | POA: Diagnosis not present

## 2017-04-28 DIAGNOSIS — R5383 Other fatigue: Secondary | ICD-10-CM | POA: Diagnosis not present

## 2017-04-29 LAB — CBC
HEMATOCRIT: 43.2 % (ref 38.5–50.0)
Hemoglobin: 15.2 g/dL (ref 13.2–17.1)
MCH: 30 pg (ref 27.0–33.0)
MCHC: 35.2 g/dL (ref 32.0–36.0)
MCV: 85.2 fL (ref 80.0–100.0)
MPV: 9.9 fL (ref 7.5–12.5)
Platelets: 257 10*3/uL (ref 140–400)
RBC: 5.07 10*6/uL (ref 4.20–5.80)
RDW: 12.3 % (ref 11.0–15.0)
WBC: 4.6 10*3/uL (ref 3.8–10.8)

## 2017-04-29 LAB — COMPLETE METABOLIC PANEL WITH GFR
AG RATIO: 1.4 (calc) (ref 1.0–2.5)
ALKALINE PHOSPHATASE (APISO): 51 U/L (ref 40–115)
ALT: 21 U/L (ref 9–46)
AST: 16 U/L (ref 10–40)
Albumin: 4.2 g/dL (ref 3.6–5.1)
BUN: 16 mg/dL (ref 7–25)
CALCIUM: 9.2 mg/dL (ref 8.6–10.3)
CO2: 27 mmol/L (ref 20–32)
Chloride: 105 mmol/L (ref 98–110)
Creat: 0.92 mg/dL (ref 0.60–1.35)
GFR, EST NON AFRICAN AMERICAN: 101 mL/min/{1.73_m2} (ref >=60)
GFR, Est African American: 117 mL/min/{1.73_m2} (ref >=60)
GLOBULIN: 2.9 g/dL (ref 1.9–3.7)
Glucose, Bld: 111 mg/dL — ABNORMAL HIGH (ref 65–99)
POTASSIUM: 4.2 mmol/L (ref 3.5–5.3)
SODIUM: 139 mmol/L (ref 135–146)
Total Bilirubin: 0.8 mg/dL (ref 0.2–1.2)
Total Protein: 7.1 g/dL (ref 6.1–8.1)

## 2017-04-29 LAB — LIPID PANEL W/REFLEX DIRECT LDL
Cholesterol: 170 mg/dL (ref <200)
HDL: 52 mg/dL (ref >40)
LDL Cholesterol (Calc): 104 mg/dL (calc) — ABNORMAL HIGH
NON-HDL CHOLESTEROL (CALC): 118 mg/dL (ref <130)
Total CHOL/HDL Ratio: 3.3 (calc) (ref <5.0)
Triglycerides: 46 mg/dL (ref <150)

## 2017-04-29 LAB — TESTOSTERONE: TESTOSTERONE: 449 ng/dL (ref 250–827)

## 2017-04-29 LAB — TSH: TSH: 1.82 m[IU]/L (ref 0.40–4.50)

## 2017-05-01 ENCOUNTER — Encounter: Payer: Self-pay | Admitting: Family Medicine

## 2017-05-01 ENCOUNTER — Ambulatory Visit (HOSPITAL_COMMUNITY): Payer: Federal, State, Local not specified - PPO | Admitting: Licensed Clinical Social Worker

## 2017-05-01 DIAGNOSIS — Z7289 Other problems related to lifestyle: Secondary | ICD-10-CM

## 2017-05-01 DIAGNOSIS — R7301 Impaired fasting glucose: Secondary | ICD-10-CM | POA: Insufficient documentation

## 2017-05-01 DIAGNOSIS — F331 Major depressive disorder, recurrent, moderate: Secondary | ICD-10-CM | POA: Diagnosis not present

## 2017-05-01 DIAGNOSIS — F1090 Alcohol use, unspecified, uncomplicated: Secondary | ICD-10-CM

## 2017-05-01 DIAGNOSIS — F411 Generalized anxiety disorder: Secondary | ICD-10-CM

## 2017-05-01 NOTE — Progress Notes (Signed)
   THERAPIST PROGRESS NOTE  Session Time: 3:01pm-3:53am  Participation Level: Active  Behavioral Response: CasualAlert Frustrated  Type of Therapy: Individual Therapy  Treatment Goals addressed: Reduce negative thinking and improve frustration tolerance   Interventions: Assessment, Treatment planning  Suicidal/Homicidal: Denied both  Therapist Interventions:  Gathered information about reasons for returning for therapy.   Had patient complete a PHQ9 and GAD7 to assess for presence and severity of symptoms of depression and anxiety. Updated treatment plan.  Reminded patient of the types of interventions he can expect as he participates in therapy (cognitive restructuring, mindfulness, etc)   Summary:  Reported after his yearly physical he decided to schedule a therapy appointment at the recommendation of his PCP.  He acknowledges he still gets stuck on negative thoughts and feels irritated a lot.     [facility] 05/01/2017 04/10/2017 05/24/2016 04/19/2016  Decreased Interest 1 1 1 1   Down, Depressed, Hopeless 2 2 1 1   PHQ - 2 Score 3 3 2 2   Altered sleeping 2 0 0 0  Tired, decreased energy 1 2 2 2   Change in appetite 0 1 2 2   Feeling bad or failure about yourself  2 2 2 1   Trouble concentrating 2 0 2 2  Moving slowly or fidgety/restless 0 1 2 0  Suicidal thoughts 0 0 0 0  PHQ-9 Score 10 9 12 9   Difficult doing work/chores - Somewhat difficult - -   GAD 7 : Generalized Anxiety Score 05/01/2017 05/24/2016 04/19/2016  Nervous, Anxious, on Edge 2 3 3   Control/stop worrying 2 2 3   Worry too much - different things 2 3 3   Trouble relaxing 2 3 2   Restless 2 1 3   Easily annoyed or irritable 3 3 3   Afraid - awful might happen 2 1 2   Total GAD 7 Score 15 16 19   Anxiety Difficulty Somewhat difficult Somewhat difficult Very difficult   Reported no significant changes in his life stressors.  Noted he considers his job to be pretty stressful.   Admitted he continues to use  alcohol to cope with stress.  Expressed a belief that drinking helps him sleep.  Does not appear to be motivated to cut down his use.           Plan: Return in approximately 2 weeks.  May introduce Mindfulness.  Diagnosis: Generalized Anxiety Disorder                         MDD recurrent, moderate    Darrin LuisSolomon, Navya Timmons A, LCSW 05/01/2017

## 2017-05-18 ENCOUNTER — Ambulatory Visit (HOSPITAL_COMMUNITY): Payer: Self-pay | Admitting: Licensed Clinical Social Worker

## 2017-06-06 ENCOUNTER — Ambulatory Visit (HOSPITAL_COMMUNITY): Payer: Self-pay | Admitting: Licensed Clinical Social Worker

## 2017-06-07 ENCOUNTER — Encounter: Payer: Self-pay | Admitting: Family Medicine

## 2017-06-07 ENCOUNTER — Ambulatory Visit: Payer: Federal, State, Local not specified - PPO | Admitting: Family Medicine

## 2017-06-07 VITALS — BP 109/73 | HR 78 | Temp 97.9°F | Ht 67.01 in | Wt 168.0 lb

## 2017-06-07 DIAGNOSIS — H60391 Other infective otitis externa, right ear: Secondary | ICD-10-CM | POA: Diagnosis not present

## 2017-06-07 DIAGNOSIS — Z8669 Personal history of other diseases of the nervous system and sense organs: Secondary | ICD-10-CM

## 2017-06-07 MED ORDER — CIPROFLOXACIN HCL 0.2 % OT SOLN: 0.2000 mL | Freq: Two times a day (BID) | OTIC | 3 refills | Status: DC

## 2017-06-07 MED ORDER — CEFDINIR 300 MG PO CAPS: 300.0000 mg | ORAL_CAPSULE | Freq: Two times a day (BID) | ORAL | 0 refills | Status: DC

## 2017-06-07 NOTE — Patient Instructions (Addendum)
Thank you for coming in today. Use the ear drops twice daily for 1 weeks.  If not better fill and take the oral antibiotic.  Recheck as needed.

## 2017-06-08 NOTE — Progress Notes (Signed)
Matthew Marks is a 42 y.o. male who [facility] today for right ear pain and discharge.  Matthew Marks has a history of chronic ear infection. Over the past two weeks he has had congestion and runny nose. A few days ago he developed pain in the right ear. He notes the pain worsened and then started draining. He denies any fever or chills. He has a history of current right TM placement. He has not tried any medications yet. He notes decreased hearing in the right ear.    Past Medical History:  Diagnosis Date  . H/O chronic ear infection 07/04/2014   Past Surgical History:  Procedure Laterality Date  . left knee surgery ligament tension    . TYMPANOSTOMY TUBE PLACEMENT     as a child and as an adult   Social History   Tobacco Use  . Smoking status: Never Smoker  . Smokeless tobacco: Never Used  Substance Use Topics  . Alcohol use: Yes    Alcohol/week: 7.2 oz    Types: 12 Standard drinks or equivalent per week   family history includes Diabetes in his mother; Hypertension in his father; Prostate cancer in his father.  ROS as above:  Medications: Current Outpatient Medications  Medication Sig Dispense Refill  . sildenafil (REVATIO) 20 MG tablet Take 2-5 tablets (40-100 mg total) by mouth as needed (sex). 150 tablet 12  . cefdinir (OMNICEF) 300 MG capsule Take 1 capsule (300 mg total) by mouth 2 (two) times daily. 14 capsule 0  . Ciprofloxacin HCl 0.2 % otic solution Place 0.2 mLs into the right ear 2 (two) times daily. For 7 days 14 vial 3   No current facility-administered medications for this visit.    No Known Allergies  Health Maintenance Health Maintenance  Topic Date Due  . INFLUENZA VACCINE  12/11/2017 (Originally 10/05/2017)  . TETANUS/TDAP  03/07/2020  . HIV Screening  Completed     Exam:  BP 109/73   Pulse 78   Temp 97.9 F (36.6 C)    Ht 5' 7.01" (1.702 m)   Wt 168 lb (76.2 kg)   SpO2 96%   BMI 26.31 kg/m  Gen: Well NAD HEENT: EOMI,  MMM Right TM with intact TM tube with drainage. TM is erythematous. The left TM is normal appearing.  Mild conjunctival injection. Clear nasal discharge. No cervical LAD.  Lungs: Normal work of breathing. CTABL Heart: RRR no MRG Abd: NABS, Soft. Nondistended, Nontender Exts: Brisk capillary refill, warm and well perfused.    No results found for this or any previous visit (from the past 72 hour(s)). No results found.    Assessment and Plan: 42 y.o. male with right ear infection with draining TM tube.  Plan for cipro ear drops. If not getting better Matthew Marks will start taking the oral omicef prescription that I printed.    No orders of the defined types were placed in this encounter.  Meds ordered this encounter  Medications  . Ciprofloxacin HCl 0.2 % otic solution    Sig: Place 0.2 mLs into the right ear 2 (two) times daily. For 7 days    Dispense:  14 vial    Refill:  3  . cefdinir (OMNICEF) 300 MG capsule    Sig: Take 1 capsule (300 mg total) by mouth 2 (two) times daily.    Dispense:  14 capsule    Refill:  0  Discussed warning signs or symptoms. Please see discharge instructions. Patient expresses understanding.

## 2017-06-13 ENCOUNTER — Ambulatory Visit (HOSPITAL_COMMUNITY): Payer: Self-pay | Admitting: Licensed Clinical Social Worker

## 2017-06-19 ENCOUNTER — Ambulatory Visit: Payer: Federal, State, Local not specified - PPO | Admitting: Family Medicine

## 2017-06-19 ENCOUNTER — Encounter: Payer: Self-pay | Admitting: Family Medicine

## 2017-06-19 VITALS — BP 107/75 | HR 73 | Temp 97.7°F | Ht 67.0 in | Wt 168.0 lb

## 2017-06-19 DIAGNOSIS — F1090 Alcohol use, unspecified, uncomplicated: Secondary | ICD-10-CM

## 2017-06-19 DIAGNOSIS — R7301 Impaired fasting glucose: Secondary | ICD-10-CM | POA: Diagnosis not present

## 2017-06-19 DIAGNOSIS — Z7289 Other problems related to lifestyle: Secondary | ICD-10-CM | POA: Diagnosis not present

## 2017-06-19 DIAGNOSIS — F331 Major depressive disorder, recurrent, moderate: Secondary | ICD-10-CM | POA: Diagnosis not present

## 2017-06-19 DIAGNOSIS — Z8669 Personal history of other diseases of the nervous system and sense organs: Secondary | ICD-10-CM | POA: Diagnosis not present

## 2017-06-19 DIAGNOSIS — Z9622 Myringotomy tube(s) status: Secondary | ICD-10-CM

## 2017-06-19 DIAGNOSIS — F411 Generalized anxiety disorder: Secondary | ICD-10-CM | POA: Diagnosis not present

## 2017-06-19 LAB — POCT GLYCOSYLATED HEMOGLOBIN (HGB A1C): Hemoglobin A1C: 5.4

## 2017-06-19 NOTE — Patient Instructions (Signed)
Thank you for coming in today. Continue healthy lifestyle.  Avoid excessive alcohol.  Recheck with me yearly for physical in February.    Pay attention to mood in the fall and winter.  Recheck sooner if needed.

## 2017-06-19 NOTE — Progress Notes (Signed)
Matthew Marks is a 42 y.o. male who presents to [facility]: Primary Care [facility] Medicine today for follow-up mood, hyperglycemia, otitis externa.  Matthew Marks was seen a few weeks ago for otitis externa with drainage from TM tube.  This was treated with Cipro eardrops and oral Omnicef.  He is feeling much better now denies any drainage or pain.  Hyperglycemia: Matthew Marks had basic fasting labs done in February which showed elevated fasting blood sugar.  He notes a positive Matthew history for diabetes and notes that he is trying to eat a lower carbohydrate diet and exercise more.  Mood: Matthew Marks has a history of mild anxiety and depression.  This is managed with counseling and exercise.  He notes his symptoms are worse in the winter and better in the summer.  He is feeling a lot better now with 1 visit with counseling.  Alcohol: Matthew Marks has cut back on alcohol to about 3 drinks per day.  He feels satisfied with how things are going.   Past Medical History:  Diagnosis Date  . H/O chronic ear infection 07/04/2014   Past Surgical History:  Procedure Laterality Date  . left knee surgery ligament tension    . TYMPANOSTOMY TUBE PLACEMENT     as a child and as an adult   Social History   Tobacco Use  . Smoking status: Never Smoker  . Smokeless tobacco: Never Used  Substance Use Topics  . Alcohol use: Yes    Alcohol/week: 7.2 oz    Types: 12 Standard drinks or equivalent per week   Matthew history includes Diabetes in his mother; Hypertension in his father; Prostate cancer in his father.  ROS as above:  Medications: Current Outpatient Medications  Medication Sig Dispense Refill  . sildenafil (REVATIO) 20 MG tablet Take 2-5 tablets (40-100 mg total) by mouth as needed (sex). 150 tablet 12   No current facility-administered medications for this visit.    No Known Allergies  Health Maintenance Health  Maintenance  Topic Date Due  . INFLUENZA VACCINE  12/11/2017 (Originally 10/05/2017)  . TETANUS/TDAP  03/07/2020  . HIV Screening  Completed     Exam:  BP 107/75   Pulse 73   Temp 97.7 F (36.5 C) (Oral)   Ht 5\' 7"  (1.702 m)   Wt 168 lb (76.2 kg)   BMI 26.31 kg/m  Gen: Well NAD HEENT: EOMI,  MMM right tympanic membrane with tube in place without drainage.  Left normal. Lungs: Normal work of breathing. CTABL Heart: RRR no MRG Abd: NABS, Soft. Nondistended, Nontender Exts: Brisk capillary refill, warm and well perfused.  Psych: Alert and oriented normal speech thought process and affect.  No SI or HI expressed Depression screen Matthew Marks Matthew Marks 2/9 06/19/2017 05/01/2017 04/10/2017 05/24/2016 04/19/2016  Decreased Interest 1 1 1 1 1   Down, Depressed, Hopeless 1 2 2 1 1   PHQ - 2 Score 2 3 3 2 2   Altered sleeping 0 2 0 0 0  Tired, decreased energy 2 1 2 2 2   Change in appetite 1 0 1 2 2   Feeling bad or failure about yourself  1 2 2 2 1   Trouble concentrating 0 2 0 2 2  Moving slowly or fidgety/restless 0 0 1 2 0  Suicidal thoughts 0 0 0 0 0  PHQ-9 Score 6 10 9 12 9   Difficult doing work/chores Not difficult at all - Somewhat difficult - -   GAD 7 : Generalized Anxiety Score 06/19/2017  05/01/2017 05/24/2016 04/19/2016  Nervous, Anxious, on Edge 1 2 3 3   Control/stop worrying 0 2 2 3   Worry too much - different things 1 2 3 3   Trouble relaxing 1 2 3 2   Restless 1 2 1 3   Easily annoyed or irritable 3 3 3 3   Afraid - awful might happen 1 2 1 2   Total GAD 7 Score 8 15 16 19   Anxiety Difficulty Not difficult at all Somewhat difficult Somewhat difficult Very difficult      Results for orders placed or performed in visit on 06/19/17 (from the past 72 hour(s))  POCT HgB A1C     Status: None   Collection Time: 06/19/17  3:32 PM  Result Value Ref Range   Hemoglobin A1C 5.4    No results found.    Assessment and Plan: 42 y.o. male with  Mood improving.  Watchful waiting continued exercise  recheck in 1 year or sooner if needed.  Consider early interventions this fall if symptoms worsen.  Hyperglycemia: A1c normal range today recommend lower carbohydrate diet and continued exercise.  Ear infection doing well.  Watchful waiting.  Alcohol: Matthew Marks cut back but still quite a bit.  Plan for continued watchful waiting recheck in the near future if needed.    Orders Placed This Encounter  Procedures  . POCT HgB A1C   No orders of the defined types were placed in this encounter.    Discussed warning signs or symptoms. Please see discharge instructions. Patient expresses understanding.

## 2017-07-27 DIAGNOSIS — L929 Granulomatous disorder of the skin and subcutaneous tissue, unspecified: Secondary | ICD-10-CM | POA: Diagnosis not present

## 2017-07-27 DIAGNOSIS — L570 Actinic keratosis: Secondary | ICD-10-CM | POA: Diagnosis not present

## 2017-09-01 DIAGNOSIS — R0789 Other chest pain: Secondary | ICD-10-CM | POA: Diagnosis not present

## 2017-09-01 DIAGNOSIS — F17290 Nicotine dependence, other tobacco product, uncomplicated: Secondary | ICD-10-CM | POA: Diagnosis not present

## 2017-09-01 DIAGNOSIS — I499 Cardiac arrhythmia, unspecified: Secondary | ICD-10-CM | POA: Diagnosis not present

## 2017-09-01 DIAGNOSIS — R079 Chest pain, unspecified: Secondary | ICD-10-CM | POA: Diagnosis not present

## 2017-12-15 ENCOUNTER — Other Ambulatory Visit: Payer: Self-pay | Admitting: Family Medicine

## 2017-12-15 DIAGNOSIS — N529 Male erectile dysfunction, unspecified: Secondary | ICD-10-CM

## 2018-01-07 ENCOUNTER — Encounter: Payer: Self-pay | Admitting: Emergency Medicine

## 2018-01-07 ENCOUNTER — Other Ambulatory Visit: Payer: Self-pay

## 2018-01-07 ENCOUNTER — Emergency Department (INDEPENDENT_AMBULATORY_CARE_PROVIDER_SITE_OTHER)
Admission: EM | Admit: 2018-01-07 | Discharge: 2018-01-07 | Disposition: A | Discharge status: Home / Self Care | Payer: Federal, State, Local not specified - PPO | Source: Home / Self Care | Attending: Family Medicine | Admitting: Family Medicine

## 2018-01-07 DIAGNOSIS — H6691 Otitis media, unspecified, right ear: Secondary | ICD-10-CM

## 2018-01-07 MED ORDER — CEFDINIR 300 MG PO CAPS: 300.0000 mg | ORAL_CAPSULE | Freq: Two times a day (BID) | ORAL | 0 refills | Status: AC

## 2018-01-07 NOTE — ED Triage Notes (Signed)
Here with re-occurent right ear drainage, fullness and localized pain x4 days. Hx  Chronic ear infection. Tube placed x4 yrs ago. Taking son PCN without relief. afebrile

## 2018-01-07 NOTE — ED Provider Notes (Signed)
[facility]    CSN: [id number] Arrival date & time: 01/07/18  1527     History   Chief Complaint Chief Complaint  Patient presents with  . Otalgia    Hx chronic ear infection s/p eustachian tube x4 yrs ago.     HPI Matthew Marks is a 42 y.o. male.   HPI  Matthew Marks is a 42 y.o. male presenting to UC with c/o re-current Right ear drainage and fullness with pain in Right ear for 4 days. Pain is aching and throbbing, moderate severity. Hx of chronic ear infections. He has tubes in place, which have been there for 4 years.  He does not currently have an ENT he f/u with.  Denies fever, chills, n/v/d.    Past Medical History:  Diagnosis Date  . H/O chronic ear infection 07/04/2014    Patient Active Problem List   Diagnosis Date Noted  . Elevated fasting glucose 05/01/2017  . Tinea pedis of both feet 06/07/2016  . MDD (major depressive disorder), recurrent episode, moderate (HCC) 05/25/2016  . Chronic alcohol use 05/25/2016  . GAD (generalized anxiety disorder) 04/19/2016  . Poor concentration 10/13/2015  . Globus sensation 09/04/2014  . Lumbar subluxation 07/10/2014  . H/O chronic ear infection 07/04/2014  . Family history of prostate cancer 07/04/2014  . Bilateral plantar fasciitis 07/04/2014  . Presence of tympanostomy tube in right tympanic membrane 07/04/2014    Past Surgical History:  Procedure Laterality Date  . left knee surgery ligament tension    . TYMPANOSTOMY TUBE PLACEMENT     as a child and as an adult       Home Medications    Prior to Admission medications   Medication Sig Start Date End Date Taking? Authorizing Provider  cefdinir (OMNICEF) 300 MG capsule Take 1 capsule (300 mg total) by mouth 2 (two) times daily for 10 days. 01/07/18 01/17/18  Lurene Shadow, PA-C  sildenafil (REVATIO) 20 MG tablet TAKE TWO TO FOUR TABLETS BY MOUTH ONLY AS NEEDED FOR SEX 12/15/17   Rodolph Bong, MD    Family History Family History  Problem Relation  Age of Onset  . Prostate cancer Father   . Hypertension Father   . Diabetes Mother     Social History Social History   Tobacco Use  . Smoking status: Never Smoker  . Smokeless tobacco: Never Used  Substance Use Topics  . Alcohol use: Yes    Alcohol/week: 12.0 standard drinks    Types: 12 Standard drinks or equivalent per week  . Drug use: No     Allergies   Patient has no known allergies.   Review of Systems Review of Systems  Constitutional: Negative for chills and fever.  HENT: Positive for congestion, ear discharge and ear pain. Negative for sinus pressure, sinus pain and sore throat.   Gastrointestinal: Negative for diarrhea, nausea and vomiting.  Neurological: Negative for dizziness, light-headedness and headaches.     Physical Exam Triage Vital Signs ED Triage Vitals  Enc Vitals Group     BP 01/07/18 1539 113/73     Pulse Rate 01/07/18 1539 76     Resp --      Temp 01/07/18 1539 97.6 F (36.4 C)     Temp Source 01/07/18 1539 Oral     SpO2 01/07/18 1539 97 %     Weight 01/07/18 1540 166 lb (75.3 kg)     Height --      Head Circumference --  Peak Flow --      Pain Score 01/07/18 1540 2     Pain Loc --      Pain Edu? --      Excl. in GC? --    No data found.  Updated Vital Signs BP 113/73 (BP Location: Right Arm)   Pulse 76   Temp 97.6 F (36.4 C) (Oral)   Wt 166 lb (75.3 kg)   SpO2 97%   BMI 26.00 kg/m   Visual Acuity Right Eye Distance:   Left Eye Distance:   Bilateral Distance:    Right Eye Near:   Left Eye Near:    Bilateral Near:     Physical Exam  Constitutional: He is oriented to person, place, and time. He appears well-developed and well-nourished. No distress.  HENT:  Head: Normocephalic and atraumatic.  Right Ear: There is drainage ( from PE tube). No tenderness. Tympanic membrane is erythematous.  Left Ear: Tympanic membrane normal.  Nose: Nose normal.  Mouth/Throat: Uvula is midline, oropharynx is clear and moist and  mucous membranes are normal.  Eyes: EOM are normal.  Neck: Normal range of motion. Neck supple.  Cardiovascular: Normal rate and regular rhythm.  Pulmonary/Chest: Effort normal and breath sounds normal. No stridor. No respiratory distress. He has no wheezes.  Musculoskeletal: Normal range of motion.  Neurological: He is alert and oriented to person, place, and time.  Skin: Skin is warm and dry. He is not diaphoretic.  Psychiatric: He has a normal mood and affect. His behavior is normal.  Nursing note and vitals reviewed.    UC Treatments / Results  Labs (all labs ordered are listed, but only abnormal results are displayed) Labs Reviewed - No data to display  EKG None  Radiology No results found.  Procedures Procedures (including critical care time)  Medications Ordered in UC Medications - No data to display  Initial Impression / Assessment and Plan / UC Course  I have reviewed the triage vital signs and the nursing notes.  Pertinent labs & imaging results that were available during my care of the patient were reviewed by me and considered in my medical decision making (see chart for details).     Hx and exam c/w Right AOM Will tx with omnicef and pt was most recently on Augmentin a few months ago for same.  Final Clinical Impressions(s) / UC Diagnoses   Final diagnoses:  Recurrent acute otitis media of right ear     Discharge Instructions      Please take antibiotics as prescribed and be sure to complete entire course even if you start to feel better to ensure infection does not come back.  Please follow up with family medicine or an ENT if not improving in 7-10 days or for recurrent ear infections if becoming more frequent.     ED Prescriptions    Medication Sig Dispense Auth. Provider   cefdinir (OMNICEF) 300 MG capsule Take 1 capsule (300 mg total) by mouth 2 (two) times daily for 10 days. 20 capsule Lurene Shadow, PA-C     Controlled Substance  Prescriptions [place] Controlled Substance Registry consulted? Not Applicable   Rolla Plate 01/10/18 1610

## 2018-01-07 NOTE — Discharge Instructions (Addendum)
°  Please take antibiotics as prescribed and be sure to complete entire course even if you start to feel better to ensure infection does not come back.  Please follow up with family medicine or an ENT if not improving in 7-10 days or for recurrent ear infections if becoming more frequent.

## 2018-01-08 ENCOUNTER — Other Ambulatory Visit: Payer: Self-pay | Admitting: Family Medicine

## 2018-01-08 DIAGNOSIS — N529 Male erectile dysfunction, unspecified: Secondary | ICD-10-CM

## 2018-01-08 DIAGNOSIS — Z8669 Personal history of other diseases of the nervous system and sense organs: Secondary | ICD-10-CM

## 2018-01-09 DIAGNOSIS — L570 Actinic keratosis: Secondary | ICD-10-CM | POA: Diagnosis not present

## 2018-01-09 NOTE — Telephone Encounter (Signed)
Spoke with patient about Viagra refill.  Already been done but the next time we need it we will send the 100 mg pills either to [facility] or to [facility] as it is going to be cheaper and better.  Additionally patient notes chronic recurrent ear infections.  He was recently seen in urgent care.  Plan to refer back to ENT as this may be a good idea. Previously seen [facility]

## 2018-01-17 DIAGNOSIS — H6591 Unspecified nonsuppurative otitis media, right ear: Secondary | ICD-10-CM | POA: Diagnosis not present

## 2018-05-08 DIAGNOSIS — D1801 Hemangioma of skin and subcutaneous tissue: Secondary | ICD-10-CM | POA: Diagnosis not present

## 2018-05-08 DIAGNOSIS — D485 Neoplasm of uncertain behavior of skin: Secondary | ICD-10-CM | POA: Diagnosis not present

## 2018-05-08 DIAGNOSIS — Z872 Personal history of diseases of the skin and subcutaneous tissue: Secondary | ICD-10-CM | POA: Diagnosis not present

## 2018-05-08 DIAGNOSIS — L821 Other seborrheic keratosis: Secondary | ICD-10-CM | POA: Diagnosis not present

## 2018-09-10 ENCOUNTER — Encounter: Payer: Self-pay | Admitting: Family Medicine

## 2018-09-10 ENCOUNTER — Other Ambulatory Visit: Payer: Self-pay

## 2018-09-10 ENCOUNTER — Ambulatory Visit: Payer: Federal, State, Local not specified - PPO | Admitting: Family Medicine

## 2018-09-10 VITALS — BP 126/74 | HR 62 | Temp 98.0°F | Ht 67.0 in | Wt 171.0 lb

## 2018-09-10 DIAGNOSIS — Z9622 Myringotomy tube(s) status: Secondary | ICD-10-CM

## 2018-09-10 DIAGNOSIS — H60391 Other infective otitis externa, right ear: Secondary | ICD-10-CM | POA: Diagnosis not present

## 2018-09-10 MED ORDER — CEFDINIR 300 MG PO CAPS: 300.0000 mg | ORAL_CAPSULE | Freq: Two times a day (BID) | ORAL | 0 refills | Status: DC

## 2018-09-10 NOTE — Patient Instructions (Signed)
Thank you for coming in today. Continue ofloxicin drops.  Take oral omnicef for 1 week.  If not improving let me know.  I can send antibiotic or other medicine to a pharmacy in [facility].  If you do call let me know in the message which pharmacy you want me to send the medicine too.    Otitis Externa  Otitis externa is an infection of the outer ear canal. The outer ear canal is the area between the outside of the ear and the eardrum. Otitis externa is sometimes called swimmer's ear. What are the causes? Common causes of this condition include:  Swimming in dirty water.  Moisture in the ear.  An injury to the inside of the ear.  An object stuck in the ear.  A cut or scrape on the outside of the ear. What increases the risk? You are more likely to get this condition if you go swimming often. What are the signs or symptoms?  Itching in the ear. This is often the first symptom.  Swelling of the ear.  Redness in the ear.  Ear pain. The pain may get worse when you pull on your ear.  Pus coming from the ear. How is this treated? This condition may be treated with:  Antibiotic ear drops. These are often given for 10-14 days.  Medicines to reduce itching and swelling. Follow these instructions at home:  If you were given antibiotic ear drops, use them as told by your doctor. Do not stop using them even if your condition gets better.  Take over-the-counter and prescription medicines only as told by your doctor.  Avoid getting water in your ears as told by your doctor. You may be told to avoid swimming or water sports for a few days.  Keep all follow-up visits as told by your doctor. This is important. How is this prevented?  Keep your ears dry. Use the corner of a towel to dry your ears after you swim or bathe.  Try not to scratch or put things in your ear. Doing these things makes it easier for germs to grow in your ear.  Avoid swimming in lakes, dirty water, or  pools that may not have the right amount of a chemical called chlorine. Contact a doctor if:  You have a fever.  Your ear is still red, swollen, or painful after 3 days.  You still have pus coming from your ear after 3 days.  Your redness, swelling, or pain gets worse.  You have a really bad headache.  You have redness, swelling, pain, or tenderness behind your ear. Summary  Otitis externa is an infection of the outer ear canal.  Symptoms include pain, redness, and swelling of the ear.  If you were given antibiotic ear drops, use them as told by your doctor. Do not stop using them even if your condition gets better.  Try not to scratch or put things in your ear. This information is not intended to replace advice given to you by your health care provider. Make sure you discuss any questions you have with your health care provider. Document Released: 08/10/2007 Document Revised: 07/28/2017 Document Reviewed: 07/28/2017 Elsevier Patient Education  2020 [employer].

## 2018-09-10 NOTE — Progress Notes (Signed)
Matthew Marks is a 43 y.o. male who presents to [facility] today for a right ear infection. Symptoms started on 09/07/2018. Patient started Ofloxacin ear drops on 09/08/2018. Patient reports that symptoms are improving slowly. Patient went swimming in a poll a couple weeks ago and suspects that may have initiated the infection. Patient on going on vacation to [facility] on Saturday so he wanted to make sure the ear infection is resolved.   ROS as above:  Exam:  BP 126/74   Pulse 62   Temp 98 F (36.7 C)   Ht 5\' 7"  (1.702 m)   Wt 171 lb (77.6 kg)   SpO2 100%   BMI 26.78 kg/m  Wt Readings from Last 5 Encounters:  09/10/18 171 lb (77.6 kg)  01/07/18 166 lb (75.3 kg)  06/19/17 168 lb (76.2 kg)  06/07/17 168 lb (76.2 kg)  04/10/17 167 lb (75.8 kg)    Gen: Well NAD HEENT: Clear drainage from tympanostomy tube in right ear. TM gray BL. EOMI,  MMM Lungs: Normal work of breathing. CTABL Heart: RRR no MRG Abd: NABS, Soft. Nondistended, Nontender Exts: Brisk capillary refill, warm and well perfused.   Lab and Radiology Results No results found for this or any previous visit (from the past 72 hour(s)). No results found.    Assessment and Plan: 43 y.o. male with Hx of chronic ear infections is presenting with symptoms consistent of a right ear infection for the last 3 days. Ofloxacin ear drops seem to be helping somewhat but not dramatically. Prescribed 1-week Cefdinir BID as patient is going on vacation on Saturday.  PDMP not reviewed this encounter. No orders of the defined types were placed in this encounter.  Meds ordered this encounter  Medications  . cefdinir (OMNICEF) 300 MG capsule    Sig: Take 1 capsule (300 mg total) by mouth 2 (two) times daily.    Dispense:  14 capsule    Refill:  0     Historical information moved to improve visibility of  documentation.  Past Medical History:  Diagnosis Date  . H/O chronic ear infection 07/04/2014   Past Surgical History:  Procedure Laterality Date  . left knee surgery ligament tension    . TYMPANOSTOMY TUBE PLACEMENT     as a child and as an adult   Social History   Tobacco Use  . Smoking status: Never Smoker  . Smokeless tobacco: Never Used  Substance Use Topics  . Alcohol use: Yes    Alcohol/week: 12.0 standard drinks    Types: 12 Standard drinks or equivalent per week   family history includes Diabetes in his mother; Hypertension in his father; Prostate cancer in his father.  Medications: Current Outpatient Medications  Medication Sig Dispense Refill  . sildenafil (REVATIO) 20 MG tablet TAKE TWO TO FOUR TABLETS BY MOUTH ONLY AS NEEDED FOR SEX 50 tablet 0  . cefdinir (OMNICEF) 300 MG capsule Take 1 capsule (300 mg total) by mouth 2 (two) times daily. 14 capsule 0   No current facility-administered medications for this visit.    No Known Allergies   Discussed warning signs or symptoms. Please see discharge instructions. Patient expresses understanding.   I personally was present and performed or re-performed the history, physical exam and medical decision-making activities of this service and have verified that the service and findings are accurately documented in the student's note. ___________________________________________ Lynne Leader M.D., ABFM., CAQSM. Primary Care  and Sports Medicine Adjunct Instructor of Family Medicine  [employer]

## 2018-10-01 DIAGNOSIS — B078 Other viral warts: Secondary | ICD-10-CM | POA: Diagnosis not present

## 2018-10-30 DIAGNOSIS — B078 Other viral warts: Secondary | ICD-10-CM | POA: Diagnosis not present

## 2018-12-12 DIAGNOSIS — B078 Other viral warts: Secondary | ICD-10-CM | POA: Diagnosis not present

## 2018-12-18 ENCOUNTER — Other Ambulatory Visit: Payer: Self-pay | Admitting: Family Medicine

## 2018-12-18 DIAGNOSIS — N529 Male erectile dysfunction, unspecified: Secondary | ICD-10-CM

## 2019-01-18 ENCOUNTER — Other Ambulatory Visit: Payer: Self-pay | Admitting: Family Medicine

## 2019-01-18 DIAGNOSIS — N529 Male erectile dysfunction, unspecified: Secondary | ICD-10-CM

## 2019-02-13 ENCOUNTER — Telehealth: Payer: Self-pay | Admitting: Sports Medicine

## 2019-02-13 ENCOUNTER — Other Ambulatory Visit: Payer: Self-pay | Admitting: Sports Medicine

## 2019-02-13 DIAGNOSIS — N529 Male erectile dysfunction, unspecified: Secondary | ICD-10-CM

## 2019-02-13 MED ORDER — SILDENAFIL CITRATE 20 MG PO TABS: ORAL_TABLET | ORAL | 0 refills | Status: DC

## 2019-02-13 NOTE — Telephone Encounter (Signed)
Patient was a former Dr. Georgina Snell patient and wants to schedule a transfer of care to Dr. Zigmund Daniel when his schedule opens up next year. Please call closer to time.

## 2019-02-25 DIAGNOSIS — Z03818 Encounter for observation for suspected exposure to other biological agents ruled out: Secondary | ICD-10-CM | POA: Diagnosis not present

## 2019-04-26 NOTE — Telephone Encounter (Signed)
I called pt and scheduled him with Dr.Matthews on 05/01/19

## 2019-05-01 ENCOUNTER — Encounter: Payer: Self-pay | Admitting: Family Medicine

## 2019-05-01 ENCOUNTER — Other Ambulatory Visit: Payer: Self-pay

## 2019-05-01 ENCOUNTER — Ambulatory Visit (INDEPENDENT_AMBULATORY_CARE_PROVIDER_SITE_OTHER): Payer: Federal, State, Local not specified - PPO | Admitting: Family Medicine

## 2019-05-01 VITALS — BP 123/80 | HR 90 | Temp 98.2°F | Ht 67.0 in | Wt 169.0 lb

## 2019-05-01 DIAGNOSIS — Z0000 Encounter for general adult medical examination without abnormal findings: Secondary | ICD-10-CM | POA: Diagnosis not present

## 2019-05-01 DIAGNOSIS — Z13220 Encounter for screening for lipoid disorders: Secondary | ICD-10-CM | POA: Diagnosis not present

## 2019-05-01 NOTE — Assessment & Plan Note (Signed)
Well adult Orders Placed This Encounter  Procedures  . COMPLETE METABOLIC PANEL WITH GFR  . CBC  . Lipid Profile  . TSH  Screening: lipid Immunizations: UTD Anticipatory guidance:  Recommendations per AVS

## 2019-05-01 NOTE — Progress Notes (Signed)
Matthew Marks - 44 y.o. male MRN [id number]  Date of birth: 1975-03-20  Subjective Chief Complaint  Patient presents with  . Annual Exam    HPI Matthew Marks is a 44 y.o. male with history of recurrent otitis media here today for annual exam.  He denies any new concerns today.    He exercises occasionally.  Does not follow any particular diet.   He is a non smoker.  He consumes about 12 beers per week.   He does have some stress related to job but is managing well.   Review of Systems  Constitutional: Negative for chills, fever, malaise/fatigue and weight loss.  HENT: Negative for congestion, ear pain and sore throat.   Eyes: Negative for blurred vision, double vision and pain.  Respiratory: Negative for cough and shortness of breath.   Cardiovascular: Negative for chest pain and palpitations.  Gastrointestinal: Negative for abdominal pain, blood in stool, constipation, heartburn and nausea.  Genitourinary: Negative for dysuria and urgency.  Musculoskeletal: Negative for joint pain and myalgias.  Neurological: Negative for dizziness and headaches.  Endo/Heme/Allergies: Does not bruise/bleed easily.  Psychiatric/Behavioral: Negative for depression. The patient is not nervous/anxious and does not have insomnia.     No Known Allergies  Past Medical History:  Diagnosis Date  . H/O chronic ear infection 07/04/2014    Past Surgical History:  Procedure Laterality Date  . left knee surgery ligament tension    . TYMPANOSTOMY TUBE PLACEMENT     as a child and as an adult    Social History   Socioeconomic History  . Marital status: Married    Spouse name: Not on file  . Number of children: Not on file  . Years of education: Not on file  . Highest education level: Not on file  Occupational History  . Not on file  Tobacco Use  . Smoking status: Never Smoker  . Smokeless tobacco: Never Used  Substance and Sexual Activity  . Alcohol use: Yes    Alcohol/week: 12.0 standard  drinks    Types: 12 Standard drinks or equivalent per week  . Drug use: No  . Sexual activity: Yes    Partners: Female  Other Topics Concern  . Not on file  Social History Narrative  . Not on file   Social Determinants of Health   Financial Resource Strain:   . Difficulty of Paying Living Expenses: Not on file  Food Insecurity:   . Worried About Charity fundraiser in the Last Year: Not on file  . Ran Out of Food in the Last Year: Not on file  Transportation Needs:   . Lack of Transportation (Medical): Not on file  . Lack of Transportation (Non-Medical): Not on file  Physical Activity:   . Days of Exercise per Week: Not on file  . Minutes of Exercise per Session: Not on file  Stress:   . Feeling of Stress : Not on file  Social Connections:   . Frequency of Communication with Friends and Family: Not on file  . Frequency of Social Gatherings with Friends and Family: Not on file  . Attends Religious Services: Not on file  . Active Member of Clubs or Organizations: Not on file  . Attends Archivist Meetings: Not on file  . Marital Status: Not on file    Family History  Problem Relation Age of Onset  . Prostate cancer Father   . Hypertension Father   . Diabetes Mother  Health Maintenance  Topic Date Due  . INFLUENZA VACCINE  06/05/2019 (Originally 10/06/2018)  . TETANUS/TDAP  03/07/2020  . HIV Screening  Completed     ----------------------------------------------------------------------------------------------------------------------------------------------------------------------------------------------------------------- Physical Exam BP 123/80   Pulse 90   Temp 98.2 F (36.8 C) (Oral)   Ht 5\' 7"  (1.702 m)   Wt 169 lb (76.7 kg)   BMI 26.47 kg/m   Physical Exam Constitutional:      General: He is not in acute distress. HENT:     Head: Normocephalic and atraumatic.     Right Ear: External ear normal.     Left Ear: External ear normal.      Mouth/Throat:     Mouth: Mucous membranes are moist.  Eyes:     General: No scleral icterus. Neck:     Thyroid: No thyromegaly.  Cardiovascular:     Rate and Rhythm: Normal rate and regular rhythm.     Heart sounds: Normal heart sounds.  Pulmonary:     Effort: Pulmonary effort is normal.     Breath sounds: Normal breath sounds.  Abdominal:     General: Bowel sounds are normal. There is no distension.     Palpations: Abdomen is soft.     Tenderness: There is no abdominal tenderness. There is no guarding.  Musculoskeletal:     Cervical back: Normal range of motion.  Lymphadenopathy:     Cervical: No cervical adenopathy.  Skin:    General: Skin is warm and dry.     Findings: No rash.  Neurological:     Mental Status: He is alert and oriented to person, place, and time.     Cranial Nerves: No cranial nerve deficit.     Motor: No abnormal muscle tone.  Psychiatric:        Mood and Affect: Mood normal.        Behavior: Behavior normal.     ------------------------------------------------------------------------------------------------------------------------------------------------------------------------------------------------------------------- Assessment and Plan  Well adult exam Well adult Orders Placed This Encounter  Procedures  . COMPLETE METABOLIC PANEL WITH GFR  . CBC  . Lipid Profile  . TSH  Screening: lipid Immunizations: UTD Anticipatory guidance:  Recommendations per AVS   No orders of the defined types were placed in this encounter.   No follow-ups on file.    This visit occurred during the SARS-CoV-2 public health emergency.  Safety protocols were in place, including screening questions prior to the visit, additional usage of staff PPE, and extensive cleaning of exam room while observing appropriate contact time as indicated for disinfecting solutions.

## 2019-05-01 NOTE — Patient Instructions (Signed)
Preventive Care 41-46 Years Old, Male Preventive care refers to lifestyle choices and visits with your health care provider that can promote health and wellness. This includes:  A yearly physical exam. This is also called an annual well check.  Regular dental and eye exams.  Immunizations.  Screening for certain conditions.  Healthy lifestyle choices, such as eating a healthy diet, getting regular exercise, not using drugs or products that contain nicotine and tobacco, and limiting alcohol use. What can I expect for my preventive care visit? Physical exam Your health care provider will check:  Height and weight. These may be used to calculate body mass index (BMI), which is a measurement that tells if you are at a healthy weight.  Heart rate and blood pressure.  Your skin for abnormal spots. Counseling Your health care provider may ask you questions about:  Alcohol, tobacco, and drug use.  Emotional well-being.  Home and relationship well-being.  Sexual activity.  Eating habits.  Work and work Statistician. What immunizations do I need?  Influenza (flu) vaccine  This is recommended every year. Tetanus, diphtheria, and pertussis (Tdap) vaccine  You may need a Td booster every 10 years. Varicella (chickenpox) vaccine  You may need this vaccine if you have not already been vaccinated. Zoster (shingles) vaccine  You may need this after age 64. Measles, mumps, and rubella (MMR) vaccine  You may need at least one dose of MMR if you were born in 1957 or later. You may also need a second dose. Pneumococcal conjugate (PCV13) vaccine  You may need this if you have certain conditions and were not previously vaccinated. Pneumococcal polysaccharide (PPSV23) vaccine  You may need one or two doses if you smoke cigarettes or if you have certain conditions. Meningococcal conjugate (MenACWY) vaccine  You may need this if you have certain conditions. Hepatitis A  vaccine  You may need this if you have certain conditions or if you travel or work in places where you may be exposed to hepatitis A. Hepatitis B vaccine  You may need this if you have certain conditions or if you travel or work in places where you may be exposed to hepatitis B. Haemophilus influenzae type b (Hib) vaccine  You may need this if you have certain risk factors. Human papillomavirus (HPV) vaccine  If recommended by your health care provider, you may need three doses over 6 months. You may receive vaccines as individual doses or as more than one vaccine together in one shot (combination vaccines). Talk with your health care provider about the risks and benefits of combination vaccines. What tests do I need? Blood tests  Lipid and cholesterol levels. These may be checked every 5 years, or more frequently if you are over 60 years old.  Hepatitis C test.  Hepatitis B test. Screening  Lung cancer screening. You may have this screening every year starting at age 43 if you have a 30-pack-year history of smoking and currently smoke or have quit within the past 15 years.  Prostate cancer screening. Recommendations will vary depending on your family history and other risks.  Colorectal cancer screening. All adults should have this screening starting at age 72 and continuing until age 2. Your health care provider may recommend screening at age 14 if you are at increased risk. You will have tests every 1-10 years, depending on your results and the type of screening test.  Diabetes screening. This is done by checking your blood sugar (glucose) after you have not eaten  for a while (fasting). You may have this done every 1-3 years.  Sexually transmitted disease (STD) testing. Follow these instructions at home: Eating and drinking  Eat a diet that includes fresh fruits and vegetables, whole grains, lean protein, and low-fat dairy products.  Take vitamin and mineral supplements as  recommended by your health care provider.  Do not drink alcohol if your health care provider tells you not to drink.  If you drink alcohol: ? Limit how much you have to 0-2 drinks a day. ? Be aware of how much alcohol is in your drink. In the [place] one drink equals one 12 oz bottle of beer (355 mL), one 5 oz glass of wine (148 mL), or one 1 oz glass of hard liquor (44 mL). Lifestyle  Take daily care of your teeth and gums.  Stay active. Exercise for at least 30 minutes on 5 or more days each week.  Do not use any products that contain nicotine or tobacco, such as cigarettes, e-cigarettes, and chewing tobacco. If you need help quitting, ask your health care provider.  If you are sexually active, practice safe sex. Use a condom or other form of protection to prevent STIs (sexually transmitted infections).  Talk with your health care provider about taking a low-dose aspirin every day starting at age 53. What's next?  Go to your health care provider once a year for a well check visit.  Ask your health care provider how often you should have your eyes and teeth checked.  Stay up to date on all vaccines. This information is not intended to replace advice given to you by your health care provider. Make sure you discuss any questions you have with your health care provider. Document Revised: 02/15/2018 Document Reviewed: 02/15/2018 Elsevier Patient Education  2020 [employer].

## 2019-05-07 DIAGNOSIS — L821 Other seborrheic keratosis: Secondary | ICD-10-CM | POA: Diagnosis not present

## 2019-05-07 DIAGNOSIS — L720 Epidermal cyst: Secondary | ICD-10-CM | POA: Diagnosis not present

## 2019-05-07 DIAGNOSIS — Z872 Personal history of diseases of the skin and subcutaneous tissue: Secondary | ICD-10-CM | POA: Diagnosis not present

## 2019-05-17 DIAGNOSIS — Z0000 Encounter for general adult medical examination without abnormal findings: Secondary | ICD-10-CM | POA: Diagnosis not present

## 2019-05-17 DIAGNOSIS — E7800 Pure hypercholesterolemia, unspecified: Secondary | ICD-10-CM | POA: Diagnosis not present

## 2019-05-17 DIAGNOSIS — Z13220 Encounter for screening for lipoid disorders: Secondary | ICD-10-CM | POA: Diagnosis not present

## 2019-05-18 LAB — COMPLETE METABOLIC PANEL WITH GFR
AG Ratio: 1.6 (calc) (ref 1.0–2.5)
ALT: 14 U/L (ref 9–46)
AST: 15 U/L (ref 10–40)
Albumin: 4.4 g/dL (ref 3.6–5.1)
Alkaline phosphatase (APISO): 45 U/L (ref 36–130)
BUN: 21 mg/dL (ref 7–25)
CO2: 28 mmol/L (ref 20–32)
Calcium: 9.7 mg/dL (ref 8.6–10.3)
Chloride: 104 mmol/L (ref 98–110)
Creat: 0.91 mg/dL (ref 0.60–1.35)
GFR, Est African American: 117 mL/min/{1.73_m2} (ref >=60)
GFR, Est Non African American: 101 mL/min/{1.73_m2} (ref >=60)
Globulin: 2.8 g/dL (calc) (ref 1.9–3.7)
Glucose, Bld: 95 mg/dL (ref 65–99)
Potassium: 5 mmol/L (ref 3.5–5.3)
Sodium: 139 mmol/L (ref 135–146)
Total Bilirubin: 0.6 mg/dL (ref 0.2–1.2)
Total Protein: 7.2 g/dL (ref 6.1–8.1)

## 2019-05-18 LAB — LIPID PANEL
Cholesterol: 187 mg/dL (ref <200)
HDL: 50 mg/dL (ref >=40)
LDL Cholesterol (Calc): 122 mg/dL (calc) — ABNORMAL HIGH
Non-HDL Cholesterol (Calc): 137 mg/dL (calc) — ABNORMAL HIGH (ref <130)
Total CHOL/HDL Ratio: 3.7 (calc) (ref <5.0)
Triglycerides: 62 mg/dL (ref <150)

## 2019-05-18 LAB — CBC
HCT: 46.8 % (ref 38.5–50.0)
Hemoglobin: 16 g/dL (ref 13.2–17.1)
MCH: 30.5 pg (ref 27.0–33.0)
MCHC: 34.2 g/dL (ref 32.0–36.0)
MCV: 89.1 fL (ref 80.0–100.0)
MPV: 10.1 fL (ref 7.5–12.5)
Platelets: 226 10*3/uL (ref 140–400)
RBC: 5.25 10*6/uL (ref 4.20–5.80)
RDW: 12.5 % (ref 11.0–15.0)
WBC: 4.7 10*3/uL (ref 3.8–10.8)

## 2019-05-18 LAB — TSH: TSH: 2.69 mIU/L (ref 0.40–4.50)

## 2019-06-25 ENCOUNTER — Other Ambulatory Visit: Payer: Self-pay

## 2019-06-25 ENCOUNTER — Ambulatory Visit: Payer: Federal, State, Local not specified - PPO | Admitting: Sports Medicine

## 2019-06-25 ENCOUNTER — Ambulatory Visit (INDEPENDENT_AMBULATORY_CARE_PROVIDER_SITE_OTHER): Payer: Federal, State, Local not specified - PPO

## 2019-06-25 DIAGNOSIS — M7521 Bicipital tendinitis, right shoulder: Secondary | ICD-10-CM | POA: Insufficient documentation

## 2019-06-25 DIAGNOSIS — M25521 Pain in right elbow: Secondary | ICD-10-CM

## 2019-06-25 DIAGNOSIS — S59901A Unspecified injury of right elbow, initial encounter: Secondary | ICD-10-CM | POA: Diagnosis not present

## 2019-06-25 MED ORDER — MELOXICAM 15 MG PO TABS: ORAL_TABLET | ORAL | 3 refills | Status: DC

## 2019-06-25 NOTE — Progress Notes (Signed)
    Procedures performed today:    None.  Independent interpretation of notes and tests performed by another provider:   None.  Brief History, Exam, Impression, and Recommendations:    Right elbow pain For 6 weeks this pleasant 44 year old male has had pain in his anterior right elbow after working hard to remove a stuck bolt on his jeep. Pain is severe, he has significant weakness and pain to supination of the elbow. He has good strength to flexion. This all indicates disruption of the distal biceps tendon at the radial tuberosity and no injury to the brachialis tendon. X-rays today, meloxicam, I am going to have him start some rehab exercises but ultimately with weakness and the duration of symptoms we need to proceed with an MRI of the right elbow. If bicipital tendinitis, or grade 1-2 strain we will probably do a distal biceps sheath injection, if grade 3 we will likely refer him to Dr. Everardo Pacific for consideration of surgical repair.    ___________________________________________ Ihor Austin. Benjamin Stain, M.D., ABFM., CAQSM. Primary Care and Sports Medicine [facility]  Adjunct Instructor of Family Medicine  [employer]

## 2019-06-25 NOTE — Assessment & Plan Note (Signed)
For 6 weeks this pleasant 44 year old male has had pain in his anterior right elbow after working hard to remove a stuck bolt on his jeep. Pain is severe, he has significant weakness and pain to supination of the elbow. He has good strength to flexion. This all indicates disruption of the distal biceps tendon at the radial tuberosity and no injury to the brachialis tendon. X-rays today, meloxicam, I am going to have him start some rehab exercises but ultimately with weakness and the duration of symptoms we need to proceed with an MRI of the right elbow. If bicipital tendinitis, or grade 1-2 strain we will probably do a distal biceps sheath injection, if grade 3 we will likely refer him to Dr. Everardo Pacific for consideration of surgical repair.

## 2019-06-30 ENCOUNTER — Other Ambulatory Visit: Payer: Self-pay

## 2019-06-30 ENCOUNTER — Ambulatory Visit (INDEPENDENT_AMBULATORY_CARE_PROVIDER_SITE_OTHER): Payer: Federal, State, Local not specified - PPO

## 2019-06-30 DIAGNOSIS — M25521 Pain in right elbow: Secondary | ICD-10-CM

## 2019-06-30 DIAGNOSIS — S46211A Strain of muscle, fascia and tendon of other parts of biceps, right arm, initial encounter: Secondary | ICD-10-CM | POA: Diagnosis not present

## 2019-07-01 ENCOUNTER — Ambulatory Visit (INDEPENDENT_AMBULATORY_CARE_PROVIDER_SITE_OTHER): Payer: Federal, State, Local not specified - PPO | Admitting: Sports Medicine

## 2019-07-01 DIAGNOSIS — M25521 Pain in right elbow: Secondary | ICD-10-CM | POA: Diagnosis not present

## 2019-07-01 NOTE — Assessment & Plan Note (Signed)
This is a pleasant 44 year old male, he had pain in his anterior right elbow after trying to remove a stuck bolt on his jeep. We did an MRI today due to the significant weakness to the biceps tendon, it shows a grade 2 strain/partial tearing of the distal biceps, but there is continuity with the radial tuberosity. We will continue activity modification and home rehab exercises for 3 weeks followed by a distal biceps sheath injection if no better at the 3-week point.

## 2019-07-01 NOTE — Progress Notes (Signed)
    Procedures performed today:    None.  Independent interpretation of notes and tests performed by another provider:   I personally reviewed Matthew Marks's MRI, we went over the images, there is significant distal tendinosis in the distal biceps tendon but there does appear to be continuity with the radial tuberosity.  Brief History, Exam, Impression, and Recommendations:    Right elbow pain This is a pleasant 44 year old male, he had pain in his anterior right elbow after trying to remove a stuck bolt on his jeep. We did an MRI today due to the significant weakness to the biceps tendon, it shows a grade 2 strain/partial tearing of the distal biceps, but there is continuity with the radial tuberosity. We will continue activity modification and home rehab exercises for 3 weeks followed by a distal biceps sheath injection if no better at the 3-week point.    ___________________________________________ Matthew Marks. Matthew Marks, M.D., ABFM., CAQSM. Primary Care and Sports Medicine [facility]  Adjunct Instructor of Family Medicine  [employer]

## 2019-07-22 ENCOUNTER — Ambulatory Visit (INDEPENDENT_AMBULATORY_CARE_PROVIDER_SITE_OTHER): Payer: Federal, State, Local not specified - PPO | Admitting: Sports Medicine

## 2019-07-22 ENCOUNTER — Encounter: Payer: Self-pay | Admitting: Sports Medicine

## 2019-07-22 DIAGNOSIS — M25521 Pain in right elbow: Secondary | ICD-10-CM

## 2019-07-22 NOTE — Progress Notes (Signed)
    Procedures performed today:    Procedure: Real-time Ultrasound Guided injection of the right distal biceps tendon sheath Device: Samsung HS60  Verbal informed consent obtained.  Time-out conducted.  Noted no overlying erythema, induration, or other signs of local infection.  Skin prepped in a sterile fashion.  Local anesthesia: Topical Ethyl chloride.  With sterile technique and under real time ultrasound guidance: 1 cc Kenalog 40, 1 cc lidocaine, 1 cc bupivacaine injected easily Completed without difficulty  Pain immediately resolved suggesting accurate placement of the medication.  Advised to call if fevers/chills, erythema, induration, drainage, or persistent bleeding.  Images permanently stored and available for review in the ultrasound unit.  Impression: Technically successful ultrasound guided injection.  Independent interpretation of notes and tests performed by another provider:   None.  Brief History, Exam, Impression, and Recommendations:    Right elbow pain Matthew Marks returns, he is a pleasant 44 year old male, he had some anterior right elbow pain after trying to remove a stuck bolt on his jeep, ultimately an MRI showed a grade 2 sprain/partial tearing of the distal biceps but continuity with the radial tuberosity. We tried 3 weeks of conservative treatment without any improvement whatsoever. Today I performed a distal biceps sheath injection from a dorsal approach. I would like to see him back in approximately 1 month.    ___________________________________________ Matthew Marks. Matthew Marks, M.D., ABFM., CAQSM. Primary Care and Sports Medicine [facility]  Adjunct Instructor of Family Medicine  [employer]

## 2019-07-22 NOTE — Assessment & Plan Note (Signed)
Matthew Marks returns, he is a pleasant 44 year old male, he had some anterior right elbow pain after trying to remove a stuck bolt on his jeep, ultimately an MRI showed a grade 2 sprain/partial tearing of the distal biceps but continuity with the radial tuberosity. We tried 3 weeks of conservative treatment without any improvement whatsoever. Today I performed a distal biceps sheath injection from a dorsal approach. I would like to see him back in approximately 1 month.

## 2019-08-19 ENCOUNTER — Other Ambulatory Visit: Payer: Self-pay

## 2019-08-19 ENCOUNTER — Ambulatory Visit: Payer: Federal, State, Local not specified - PPO | Admitting: Sports Medicine

## 2019-08-19 DIAGNOSIS — M25521 Pain in right elbow: Secondary | ICD-10-CM | POA: Diagnosis not present

## 2019-08-19 NOTE — Assessment & Plan Note (Signed)
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

## 2019-08-19 NOTE — Progress Notes (Signed)
    Procedures performed today:    None.  Independent interpretation of notes and tests performed by another provider:   None.  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    ___________________________________________ Ihor Austin. Benjamin Stain, M.D., ABFM., CAQSM. Primary Care and Sports Medicine [facility]  Adjunct Instructor of Family Medicine  [employer]

## 2019-08-29 ENCOUNTER — Encounter: Payer: Self-pay | Admitting: Family Medicine

## 2019-08-30 ENCOUNTER — Ambulatory Visit: Payer: Federal, State, Local not specified - PPO | Admitting: Family Medicine

## 2019-08-30 ENCOUNTER — Encounter: Payer: Self-pay | Admitting: Family Medicine

## 2019-08-30 VITALS — BP 134/95 | HR 55 | Ht 66.93 in | Wt 166.0 lb

## 2019-08-30 DIAGNOSIS — T63481A Toxic effect of venom of other arthropod, accidental (unintentional), initial encounter: Secondary | ICD-10-CM | POA: Insufficient documentation

## 2019-08-30 DIAGNOSIS — T63451A Toxic effect of venom of hornets, accidental (unintentional), initial encounter: Secondary | ICD-10-CM

## 2019-08-30 DIAGNOSIS — T63461A Toxic effect of venom of wasps, accidental (unintentional), initial encounter: Secondary | ICD-10-CM

## 2019-08-30 DIAGNOSIS — T63441A Toxic effect of venom of bees, accidental (unintentional), initial encounter: Secondary | ICD-10-CM | POA: Diagnosis not present

## 2019-08-30 MED ORDER — METHYLPREDNISOLONE ACETATE 40 MG/ML IJ SUSP
40.0000 mg | Freq: Once | INTRAMUSCULAR | Status: AC
  Administered 2019-08-30: 40 mg via INTRAMUSCULAR

## 2019-08-30 NOTE — Assessment & Plan Note (Signed)
Given injection of depo-medrol 40mg  today.  He may continue topical steroid to area as well.  Discussed red flags and symptoms of infection.  F/u PRN for this.

## 2019-08-30 NOTE — Patient Instructions (Signed)
Bee, Wasp, or [employer], Adult Bees, wasps, and hornets are part of a family of insects that can sting people. These stings can cause pain and inflammation, but they are usually not serious. However, some people may have an allergic reaction to a sting. This can cause the symptoms to be more severe. What increases the risk? You may be at a greater risk of getting stung if you:  Provoke a stinging insect by swatting or disturbing it.  Wear strong-smelling soaps, deodorants, or body sprays.  Spend time outdoors near gardens with flowers or fruit trees or in clothes that expose skin.  Eat or drink outside. What are the signs or symptoms? Common symptoms of this condition include:  A red lump in the skin that sometimes has a tiny hole in the center. In some cases, a stinger may be in the center of the wound.  Pain and itching at the sting site.  Redness and swelling around the sting site. If you have an allergic reaction (localized allergic reaction), the swelling and redness may spread out from the sting site. In some cases, this reaction can continue to develop over the next 24-48 hours. In rare cases, a person may have a severe allergic reaction (anaphylactic reaction) to a sting. Symptoms of an anaphylactic reaction may include:  Wheezing or difficulty breathing.  Raised, itchy, red patches on the skin (hives).  Nausea or vomiting.  Abdominal cramping.  Diarrhea.  Tightness in the chest or chest pain.  Dizziness or fainting.  Redness of the face (flushing).  Hoarse voice.  Swollen tongue, lips, or face. How is this diagnosed? This condition is usually diagnosed based on your symptoms and medical history as well as a physical exam. You may have an allergy test to determine if you are allergic to the substance that the insect injected during the sting (venom). How is this treated? If you were stung by a bee, the stinger and a small sac of venom may be in the wound. It is  important to remove the stinger as soon as possible. You can do this by brushing across the wound with gauze, a fingernail, or a flat card such as a credit card. Removing the stinger can help reduce the severity of your body's reaction to the sting. Most stings can be treated with:  Icing to reduce swelling in the area.  Medicines (antihistamines) to treat itching or an allergic reaction.  Medicines to help reduce pain. These may be medicines that you take by mouth, or medicated creams or lotions that you apply to your skin. Pay close attention to your symptoms after you have been stung. If possible, have someone stay with you to make sure you do not have an allergic reaction. If you have any signs of an allergic reaction, call your health care provider. If you have ever had a severe allergic reaction, your health care provider may give you an inhaler or injectable medicine (epinephrine auto-injector) to use if necessary. Follow these instructions at home:   Wash the sting site 2-3 times each day with soap and water as told by your health care provider.  Apply or take over-the-counter and prescription medicines only as told by your health care provider.  If directed, apply ice to the sting area. ? Put ice in a plastic bag. ? Place a towel between your skin and the bag. ? Leave the ice on for 20 minutes, 2-3 times a day.  Do not scratch the sting area.  If  you had a severe allergic reaction to a sting, you may need: ? To wear a medical bracelet or necklace that lists the allergy. ? To learn when and how to use an anaphylaxis kit or epinephrine injection. Your family members and coworkers may also need to learn this. ? To carry an anaphylaxis kit or epinephrine injection with you at all times. How is this prevented?  Avoid swatting at stinging insects and disturbing insect nests.  Do not use fragrant soaps or lotions.  Wear shoes, pants, and long sleeves when spending time outdoors,  especially in grassy areas where stinging insects are common.  Keep outdoor areas free from nests or hives.  Keep food and drink containers covered when eating outdoors.  Avoid working or sitting near flowering plants, if possible.  Wear gloves if you are gardening or working outdoors.  If an attack by a stinging insect or a swarm seems likely in the moment, move away from the area or find a barrier between you and the insect(s), such as a door. Contact a health care provider if:  Your symptoms do not get better in 2-3 days.  You have redness, swelling, or pain that spreads beyond the area of the sting.  You have a fever. Get help right away if: You have symptoms of a severe allergic reaction. These include:  Wheezing or difficulty breathing.  Tightness in the chest or chest pain.  Light-headedness or fainting.  Itchy, raised, red patches on the skin.  Nausea or vomiting.  Abdominal cramping.  Diarrhea.  A swollen tongue or lips, or trouble swallowing.  Dizziness or fainting. Summary  Stings from bees, wasps, and hornets can cause pain and inflammation, but they are usually not serious. However, some people may have an allergic reaction to a sting. This can cause the symptoms to be more severe.  Pay close attention to your symptoms after you have been stung. If possible, have someone stay with you to make sure you do not have an allergic reaction.  Call your health care provider if you have any signs of an allergic reaction. This information is not intended to replace advice given to you by your health care provider. Make sure you discuss any questions you have with your health care provider. Document Revised: 02/16/2017 Document Reviewed: 04/28/2016 Elsevier Patient Education  2020 Elsevier Inc.  

## 2019-08-30 NOTE — Progress Notes (Signed)
Matthew Marks - 44 y.o. male MRN [id number]  Date of birth: [birth year]  Subjective Chief Complaint  Patient presents with  . Insect Bite    HPI Matthew Marks is a 44 y.o. male here today with complaint of insect sting.  This occurred 5 days ago but still having some residual itching and swelling.  He has tried topical steroid with some improvement.  He denies any respiratory symptoms or hives.    ROS:  A comprehensive ROS was completed and negative except as noted per HPI  No Known Allergies  Past Medical History:  Diagnosis Date  . H/O chronic ear infection 07/04/2014    Past Surgical History:  Procedure Laterality Date  . left knee surgery ligament tension    . TYMPANOSTOMY TUBE PLACEMENT     as a child and as an adult    Social History   Socioeconomic History  . Marital status: Married    Spouse name: Not on file  . Number of children: Not on file  . Years of education: Not on file  . Highest education level: Not on file  Occupational History  . Not on file  Tobacco Use  . Smoking status: Never Smoker  . Smokeless tobacco: Never Used  Substance and Sexual Activity  . Alcohol use: Yes    Alcohol/week: 12.0 standard drinks    Types: 12 Standard drinks or equivalent per week  . Drug use: No  . Sexual activity: Yes    Partners: Female  Other Topics Concern  . Not on file  Social History Narrative  . Not on file   Social Determinants of Health   Financial Resource Strain:   . Difficulty of Paying Living Expenses:   Food Insecurity:   . Worried About Charity fundraiser in the Last Year:   . Arboriculturist in the Last Year:   Transportation Needs:   . Film/video editor (Medical):   Marland Kitchen Lack of Transportation (Non-Medical):   Physical Activity:   . Days of Exercise per Week:   . Minutes of Exercise per Session:   Stress:   . Feeling of Stress :   Social Connections:   . Frequency of Communication with Friends and Family:   . Frequency of Social  Gatherings with Friends and Family:   . Attends Religious Services:   . Active Member of Clubs or Organizations:   . Attends Archivist Meetings:   Marland Kitchen Marital Status:     Family History  Problem Relation Age of Onset  . Prostate cancer Father   . Hypertension Father   . Diabetes Mother     Health Maintenance  Topic Date Due  . Hepatitis C Screening  Never done  . COVID-19 Vaccine (1) Never done  . INFLUENZA VACCINE  10/06/2019  . TETANUS/TDAP  03/07/2020  . HIV Screening  Completed     ----------------------------------------------------------------------------------------------------------------------------------------------------------------------------------------------------------------- Physical Exam BP (!) 134/95 (BP Location: Left Arm, Patient Position: Sitting, Cuff Size: Normal)   Pulse (!) 55   Ht 5' 6.93" (1.7 m)   Wt 166 lb (75.3 kg)   SpO2 100%   BMI 26.05 kg/m   Physical Exam Constitutional:      Appearance: Normal appearance.  HENT:     Head: Normocephalic and atraumatic.  Cardiovascular:     Rate and Rhythm: Normal rate and regular rhythm.  Musculoskeletal:     Cervical back: Neck supple.     Comments: Mild swelling or lateral R leg.  Non-tender.  Skin:    General: Skin is warm and dry.  Neurological:     General: No focal deficit present.     Mental Status: He is alert.  Psychiatric:        Mood and Affect: Mood normal.        Behavior: Behavior normal.     ------------------------------------------------------------------------------------------------------------------------------------------------------------------------------------------------------------------- Assessment and Plan  Insect sting Given injection of depo-medrol 40mg  today.  He may continue topical steroid to area as well.  Discussed red flags and symptoms of infection.  F/u PRN for this.    Meds ordered this encounter  Medications  . methylPREDNISolone  acetate (DEPO-MEDROL) injection 40 mg    No follow-ups on file.    This visit occurred during the SARS-CoV-2 public health emergency.  Safety protocols were in place, including screening questions prior to the visit, additional usage of staff PPE, and extensive cleaning of exam room while observing appropriate contact time as indicated for disinfecting solutions.

## 2019-09-01 ENCOUNTER — Other Ambulatory Visit: Payer: Self-pay

## 2019-09-01 ENCOUNTER — Emergency Department
Admission: EM | Admit: 2019-09-01 | Discharge: 2019-09-01 | Disposition: A | Discharge status: Home / Self Care | Payer: Federal, State, Local not specified - PPO | Source: Home / Self Care

## 2019-09-01 ENCOUNTER — Emergency Department: Admit: 2019-09-01 | Discharge status: Absent without leave | Payer: Self-pay

## 2019-09-01 DIAGNOSIS — L509 Urticaria, unspecified: Secondary | ICD-10-CM

## 2019-09-01 MED ORDER — TRIAMCINOLONE ACETONIDE 0.1 % EX CREA
1.0000 | TOPICAL_CREAM | Freq: Two times a day (BID) | CUTANEOUS | 0 refills | Status: DC
Start: 2019-09-01 — End: 2022-03-18

## 2019-09-01 MED ORDER — PREDNISONE 20 MG PO TABS
ORAL_TABLET | ORAL | 0 refills | Status: DC
Start: 2019-09-01 — End: 2019-10-08

## 2019-09-01 MED ORDER — CETIRIZINE HCL 10 MG PO TABS: 10.0000 mg | ORAL_TABLET | Freq: Every day | ORAL | 0 refills | Status: DC

## 2019-09-01 NOTE — ED Triage Notes (Signed)
Pt states that he was given a steroid shot on Friday and began to break out in a rash all over. Pt states that he has not been vaccinated.

## 2019-09-01 NOTE — ED Provider Notes (Signed)
[facility]    CSN: [id number] Arrival date & time: 09/01/19  1534      History   Chief Complaint Chief Complaint  Patient presents with  . Rash    HPI Matthew Marks is a 44 y.o. male.   HPI Matthew Marks is a 44 y.o. male presenting to UC with c/o itchy red rash under his arms and along waist line that started after working in his garage yesterday and today.  He was seen by his PCP 2 days ago for a bee sting to his Right knee that had swelling. He received a steroid injection at that time, that swelling has nearly resolved.  He is not taking any antihistamines or PO prednisone. No known allergies. He has lived in the same house for many years. No new soaps, lotions or medications.    Past Medical History:  Diagnosis Date  . H/O chronic ear infection 07/04/2014    Patient Active Problem List   Diagnosis Date Noted  . Insect sting 08/30/2019  . Right elbow pain 06/25/2019  . Well adult exam 05/01/2019  . Elevated fasting glucose 05/01/2017  . Tinea pedis of both feet 06/07/2016  . MDD (major depressive disorder), recurrent episode, moderate (HCC) 05/25/2016  . Chronic alcohol use 05/25/2016  . GAD (generalized anxiety disorder) 04/19/2016  . Poor concentration 10/13/2015  . Globus sensation 09/04/2014  . Lumbar subluxation 07/10/2014  . H/O chronic ear infection 07/04/2014  . Family history of prostate cancer 07/04/2014  . Bilateral plantar fasciitis 07/04/2014  . Presence of tympanostomy tube in right tympanic membrane 07/04/2014    Past Surgical History:  Procedure Laterality Date  . left knee surgery ligament tension    . TYMPANOSTOMY TUBE PLACEMENT     as a child and as an adult       Home Medications    Prior to Admission medications   Medication Sig Start Date End Date Taking? Authorizing Provider  cetirizine (ZYRTEC) 10 MG tablet Take 1 tablet (10 mg total) by mouth daily. 09/01/19   Lurene Shadow, PA-C  predniSONE (DELTASONE) 20 MG tablet 3  tabs po daily x 3 days, then 2 tabs x 3 days, then 1.5 tabs x 3 days, then 1 tab x 3 days, then 0.5 tabs x 3 days 09/01/19   Lurene Shadow, PA-C  sildenafil (REVATIO) 20 MG tablet TAKE TWO TO FOUR TABLETS BY MOUTH ONLY AS NEEDED FOR SEX. 02/13/19   Monica Becton, MD  triamcinolone cream (KENALOG) 0.1 % Apply 1 application topically 2 (two) times daily. 09/01/19   Lurene Shadow, PA-C    Family History Family History  Problem Relation Age of Onset  . Prostate cancer Father   . Hypertension Father   . Diabetes Mother     Social History Social History   Tobacco Use  . Smoking status: Never Smoker  . Smokeless tobacco: Never Used  Substance Use Topics  . Alcohol use: Yes    Alcohol/week: 12.0 standard drinks    Types: 12 Standard drinks or equivalent per week  . Drug use: No     Allergies   Patient has no known allergies.   Review of Systems Review of Systems  Musculoskeletal: Negative for arthralgias and joint swelling.  Skin: Positive for rash. Negative for wound.     Physical Exam Triage Vital Signs ED Triage Vitals  Enc Vitals Group     BP 09/01/19 1601 134/86     Pulse Rate 09/01/19 1601 69  Resp --      Temp 09/01/19 1601 98.5 F (36.9 C)     Temp src --      SpO2 09/01/19 1601 97 %     Weight 09/01/19 1559 163 lb (73.9 kg)     Height 09/01/19 1559 5\' 7"  (1.702 m)     Head Circumference --      Peak Flow --      Pain Score 09/01/19 1559 0     Pain Loc --      Pain Edu? --      Excl. in GC? --    No data found.  Updated Vital Signs BP 134/86   Pulse 69   Temp 98.5 F (36.9 C)   Ht 5\' 7"  (1.702 m)   Wt 163 lb (73.9 kg)   SpO2 97%   BMI 25.53 kg/m   Visual Acuity Right Eye Distance:   Left Eye Distance:   Bilateral Distance:    Right Eye Near:   Left Eye Near:    Bilateral Near:     Physical Exam Vitals and nursing note reviewed.  Constitutional:      Appearance: Normal appearance. He is well-developed.  HENT:     Head:  Normocephalic and atraumatic.  Cardiovascular:     Rate and Rhythm: Normal rate.  Pulmonary:     Effort: Pulmonary effort is normal.  Musculoskeletal:        General: Normal range of motion.     Cervical back: Normal range of motion.  Skin:    General: Skin is warm and dry.     Findings: Erythema and rash present. Rash is urticarial.          Comments: Diffuse urticarial type rash under both axilla and along waistline. Non-tender. No drainage.   Neurological:     Mental Status: He is alert and oriented to person, place, and time.  Psychiatric:        Behavior: Behavior normal.      UC Treatments / Results  Labs (all labs ordered are listed, but only abnormal results are displayed) Labs Reviewed - No data to display  EKG   Radiology No results found.  Procedures Procedures (including critical care time)  Medications Ordered in UC Medications - No data to display  Initial Impression / Assessment and Plan / UC Course  I have reviewed the triage vital signs and the nursing notes.  Pertinent labs & imaging results that were available during my care of the patient were reviewed by me and considered in my medical decision making (see chart for details).    Exam c/w allergic reaction Will tx with oral and topical steroids and antihistamines F/u with PCP Pt may need to f/u with allergist  AVS given  Final Clinical Impressions(s) / UC Diagnoses   Final diagnoses:  Urticaria   Discharge Instructions   None    ED Prescriptions    Medication Sig Dispense Auth. Provider   predniSONE (DELTASONE) 20 MG tablet 3 tabs po daily x 3 days, then 2 tabs x 3 days, then 1.5 tabs x 3 days, then 1 tab x 3 days, then 0.5 tabs x 3 days 27 tablet 09/03/19, Myli Pae O, PA-C   triamcinolone cream (KENALOG) 0.1 % Apply 1 application topically 2 (two) times daily. 30 g Doroteo Glassman, PA-C   cetirizine (ZYRTEC) 10 MG tablet Take 1 tablet (10 mg total) by mouth daily. 30 tablet 10-17-1986     PDMP  not reviewed this encounter.   Noe Gens, [place] 09/02/19 [phone]

## 2019-09-03 ENCOUNTER — Ambulatory Visit: Payer: Federal, State, Local not specified - PPO | Admitting: Family Medicine

## 2019-09-03 DIAGNOSIS — Z79899 Other long term (current) drug therapy: Secondary | ICD-10-CM | POA: Diagnosis not present

## 2019-09-03 DIAGNOSIS — L508 Other urticaria: Secondary | ICD-10-CM | POA: Diagnosis not present

## 2019-09-03 DIAGNOSIS — L509 Urticaria, unspecified: Secondary | ICD-10-CM | POA: Diagnosis not present

## 2019-09-03 DIAGNOSIS — L519 Erythema multiforme, unspecified: Secondary | ICD-10-CM | POA: Diagnosis not present

## 2019-09-18 ENCOUNTER — Ambulatory Visit: Payer: Federal, State, Local not specified - PPO | Admitting: Family Medicine

## 2019-09-23 DIAGNOSIS — T63441D Toxic effect of venom of bees, accidental (unintentional), subsequent encounter: Secondary | ICD-10-CM | POA: Diagnosis not present

## 2019-09-23 DIAGNOSIS — L500 Allergic urticaria: Secondary | ICD-10-CM | POA: Diagnosis not present

## 2019-10-02 DIAGNOSIS — R05 Cough: Secondary | ICD-10-CM | POA: Diagnosis not present

## 2019-10-02 DIAGNOSIS — J3489 Other specified disorders of nose and nasal sinuses: Secondary | ICD-10-CM | POA: Diagnosis not present

## 2019-10-02 DIAGNOSIS — Z20822 Contact with and (suspected) exposure to covid-19: Secondary | ICD-10-CM | POA: Diagnosis not present

## 2019-10-02 DIAGNOSIS — T63461D Toxic effect of venom of wasps, accidental (unintentional), subsequent encounter: Secondary | ICD-10-CM | POA: Diagnosis not present

## 2019-10-02 DIAGNOSIS — R0981 Nasal congestion: Secondary | ICD-10-CM | POA: Diagnosis not present

## 2019-10-02 DIAGNOSIS — T63451D Toxic effect of venom of hornets, accidental (unintentional), subsequent encounter: Secondary | ICD-10-CM | POA: Diagnosis not present

## 2019-10-03 DIAGNOSIS — T63461D Toxic effect of venom of wasps, accidental (unintentional), subsequent encounter: Secondary | ICD-10-CM | POA: Diagnosis not present

## 2019-10-03 DIAGNOSIS — T63451D Toxic effect of venom of hornets, accidental (unintentional), subsequent encounter: Secondary | ICD-10-CM | POA: Diagnosis not present

## 2019-10-08 ENCOUNTER — Encounter: Payer: Self-pay | Admitting: Family Medicine

## 2019-10-08 ENCOUNTER — Ambulatory Visit: Payer: Federal, State, Local not specified - PPO | Admitting: Family Medicine

## 2019-10-08 ENCOUNTER — Other Ambulatory Visit: Payer: Self-pay

## 2019-10-08 DIAGNOSIS — F411 Generalized anxiety disorder: Secondary | ICD-10-CM

## 2019-10-08 DIAGNOSIS — R058 Other specified cough: Secondary | ICD-10-CM

## 2019-10-08 DIAGNOSIS — R05 Cough: Secondary | ICD-10-CM | POA: Diagnosis not present

## 2019-10-08 NOTE — Patient Instructions (Signed)
Continue benzonatate as needed. Stay well hydrated Let me know if cough doesn't resolve.  We'll be in touch once form is complete.

## 2019-10-09 ENCOUNTER — Encounter: Payer: Self-pay | Admitting: Family Medicine

## 2019-10-10 ENCOUNTER — Other Ambulatory Visit: Payer: Self-pay | Admitting: Family Medicine

## 2019-10-10 DIAGNOSIS — R058 Other specified cough: Secondary | ICD-10-CM | POA: Insufficient documentation

## 2019-10-10 MED ORDER — BENZONATATE 100 MG PO CAPS
100.0000 mg | ORAL_CAPSULE | Freq: Three times a day (TID) | ORAL | 0 refills | Status: DC | PRN
Start: 2019-10-10 — End: 2020-01-02

## 2019-10-10 NOTE — Assessment & Plan Note (Signed)
Will complete form for accomodation.  Reasonable to work remotely if able to complete all parts of his job and going into office exacerbates anxiety.

## 2019-10-10 NOTE — Progress Notes (Signed)
Matthew Marks - 44 y.o. male MRN [id number]  Date of birth: 09-Feb-1976  Subjective Chief Complaint  Patient presents with  . Cough    HPI Matthew Marks is a 44 y.o. male here today with complaint of cough.  Seen at urgent care on 7/28 and diagnosed with post viral cough.  Had URI symptoms couple of weeks prior to this.  He denies fever, chills, shortness of breath.  He was prescribed tessalon and reports that this is controlling pretty well.  Symptoms do seem to be improving slowly.   He also requests form completed to accomodation for him to continue working from home.  Currently going into the the office and having to wear a mask increases his anxiety.  He works much more effectively at home and is able to accomplish all duties of his job from home.    ROS:  A comprehensive ROS was completed and negative except as noted per HPI  Allergies  Allergen Reactions  . Bee Venom Anaphylaxis    Past Medical History:  Diagnosis Date  . H/O chronic ear infection 07/04/2014    Past Surgical History:  Procedure Laterality Date  . left knee surgery ligament tension    . TYMPANOSTOMY TUBE PLACEMENT     as a child and as an adult    Social History   Socioeconomic History  . Marital status: Married    Spouse name: Not on file  . Number of children: Not on file  . Years of education: Not on file  . Highest education level: Not on file  Occupational History  . Not on file  Tobacco Use  . Smoking status: Never Smoker  . Smokeless tobacco: Never Used  Substance and Sexual Activity  . Alcohol use: Yes    Alcohol/week: 12.0 standard drinks    Types: 12 Standard drinks or equivalent per week  . Drug use: No  . Sexual activity: Yes    Partners: Female  Other Topics Concern  . Not on file  Social History Narrative  . Not on file   Social Determinants of Health   Financial Resource Strain:   . Difficulty of Paying Living Expenses:   Food Insecurity:   . Worried About Brewing technologist in the Last Year:   . Barista in the Last Year:   Transportation Needs:   . Freight forwarder (Medical):   Marland Kitchen Lack of Transportation (Non-Medical):   Physical Activity:   . Days of Exercise per Week:   . Minutes of Exercise per Session:   Stress:   . Feeling of Stress :   Social Connections:   . Frequency of Communication with Friends and Family:   . Frequency of Social Gatherings with Friends and Family:   . Attends Religious Services:   . Active Member of Clubs or Organizations:   . Attends Banker Meetings:   Marland Kitchen Marital Status:     Family History  Problem Relation Age of Onset  . Prostate cancer Father   . Hypertension Father   . Diabetes Mother     Health Maintenance  Topic Date Due  . Hepatitis C Screening  Never done  . COVID-19 Vaccine (1) Never done  . INFLUENZA VACCINE  10/06/2019  . TETANUS/TDAP  03/07/2020  . HIV Screening  Completed     ----------------------------------------------------------------------------------------------------------------------------------------------------------------------------------------------------------------- Physical Exam BP (!) 144/95 (BP Location: Left Arm, Patient Position: Sitting, Cuff Size: Normal)   Pulse 71   Ht 5' 6.93" (  1.7 m)   Wt 167 lb (75.8 kg)   SpO2 98%   BMI 26.21 kg/m   Physical Exam Constitutional:      Appearance: Normal appearance.  HENT:     Head: Normocephalic and atraumatic.  Eyes:     General: No scleral icterus. Cardiovascular:     Rate and Rhythm: Normal rate and regular rhythm.  Pulmonary:     Effort: Pulmonary effort is normal.     Breath sounds: Normal breath sounds.  Musculoskeletal:     Cervical back: Neck supple.  Neurological:     General: No focal deficit present.     Mental Status: He is alert.  Psychiatric:        Mood and Affect: Mood normal.      ------------------------------------------------------------------------------------------------------------------------------------------------------------------------------------------------------------------- Assessment and Plan  GAD (generalized anxiety disorder) Will complete form for accomodation.  Reasonable to work remotely if able to complete all parts of his job and going into office exacerbates anxiety.   Post-viral cough syndrome Symptoms consistent with post viral cough.   He may continue tessalon as needed.  Remain well hydrated.  Call for f/u if this persists     No orders of the defined types were placed in this encounter.   No follow-ups on file.    This visit occurred during the SARS-CoV-2 public health emergency.  Safety protocols were in place, including screening questions prior to the visit, additional usage of staff PPE, and extensive cleaning of exam room while observing appropriate contact time as indicated for disinfecting solutions.

## 2019-10-10 NOTE — Assessment & Plan Note (Signed)
Symptoms consistent with post viral cough.   He may continue tessalon as needed.  Remain well hydrated.  Call for f/u if this persists

## 2019-10-11 ENCOUNTER — Encounter: Payer: Self-pay | Admitting: Family Medicine

## 2019-10-18 DIAGNOSIS — Z20822 Contact with and (suspected) exposure to covid-19: Secondary | ICD-10-CM | POA: Diagnosis not present

## 2019-10-19 DIAGNOSIS — Z20822 Contact with and (suspected) exposure to covid-19: Secondary | ICD-10-CM | POA: Diagnosis not present

## 2019-10-21 DIAGNOSIS — T63461A Toxic effect of venom of wasps, accidental (unintentional), initial encounter: Secondary | ICD-10-CM | POA: Diagnosis not present

## 2019-10-21 DIAGNOSIS — T63451A Toxic effect of venom of hornets, accidental (unintentional), initial encounter: Secondary | ICD-10-CM | POA: Diagnosis not present

## 2019-10-28 DIAGNOSIS — T63461A Toxic effect of venom of wasps, accidental (unintentional), initial encounter: Secondary | ICD-10-CM | POA: Diagnosis not present

## 2019-10-28 DIAGNOSIS — T63451A Toxic effect of venom of hornets, accidental (unintentional), initial encounter: Secondary | ICD-10-CM | POA: Diagnosis not present

## 2019-11-04 DIAGNOSIS — T63451A Toxic effect of venom of hornets, accidental (unintentional), initial encounter: Secondary | ICD-10-CM | POA: Diagnosis not present

## 2019-11-04 DIAGNOSIS — T63461A Toxic effect of venom of wasps, accidental (unintentional), initial encounter: Secondary | ICD-10-CM | POA: Diagnosis not present

## 2019-11-12 DIAGNOSIS — T63461A Toxic effect of venom of wasps, accidental (unintentional), initial encounter: Secondary | ICD-10-CM | POA: Diagnosis not present

## 2019-11-12 DIAGNOSIS — T63451A Toxic effect of venom of hornets, accidental (unintentional), initial encounter: Secondary | ICD-10-CM | POA: Diagnosis not present

## 2019-11-19 DIAGNOSIS — T63451A Toxic effect of venom of hornets, accidental (unintentional), initial encounter: Secondary | ICD-10-CM | POA: Diagnosis not present

## 2019-11-19 DIAGNOSIS — T63461A Toxic effect of venom of wasps, accidental (unintentional), initial encounter: Secondary | ICD-10-CM | POA: Diagnosis not present

## 2019-11-25 DIAGNOSIS — T63451A Toxic effect of venom of hornets, accidental (unintentional), initial encounter: Secondary | ICD-10-CM | POA: Diagnosis not present

## 2019-11-25 DIAGNOSIS — T63461A Toxic effect of venom of wasps, accidental (unintentional), initial encounter: Secondary | ICD-10-CM | POA: Diagnosis not present

## 2019-12-02 DIAGNOSIS — T63451A Toxic effect of venom of hornets, accidental (unintentional), initial encounter: Secondary | ICD-10-CM | POA: Diagnosis not present

## 2019-12-02 DIAGNOSIS — T63461A Toxic effect of venom of wasps, accidental (unintentional), initial encounter: Secondary | ICD-10-CM | POA: Diagnosis not present

## 2019-12-06 ENCOUNTER — Encounter: Payer: Self-pay | Admitting: Nurse Practitioner

## 2019-12-06 ENCOUNTER — Ambulatory Visit (INDEPENDENT_AMBULATORY_CARE_PROVIDER_SITE_OTHER): Payer: Federal, State, Local not specified - PPO | Admitting: Nurse Practitioner

## 2019-12-06 DIAGNOSIS — H6690 Otitis media, unspecified, unspecified ear: Secondary | ICD-10-CM | POA: Insufficient documentation

## 2019-12-06 MED ORDER — AMOXICILLIN-POT CLAVULANATE 875-125 MG PO TABS: 1.0000 | ORAL_TABLET | Freq: Two times a day (BID) | ORAL | 0 refills | Status: DC

## 2019-12-06 NOTE — Patient Instructions (Signed)
You can use immodium and a pro-biotic to help with diarrhea that may present with the antibiotic.   Please take all of the antibiotic, even if your symptoms improve before completion to prevent recurrence of infection and tolerance to medication.    Otitis Media, Adult  Otitis media occurs when there is inflammation and fluid in the middle ear. Your middle ear is a part of the ear that contains bones for hearing as well as air that helps send sounds to your brain. What are the causes? This condition is caused by a blockage in the eustachian tube. This tube drains fluid from the ear to the back of the nose (nasopharynx). A blockage in this tube can be caused by an object or by swelling (edema) in the tube. Problems that can cause a blockage include:  A cold or other upper respiratory infection.  Allergies.  An irritant, such as tobacco smoke.  Enlarged adenoids. The adenoids are areas of soft tissue located high in the back of the throat, behind the nose and the roof of the mouth.  A mass in the nasopharynx.  Damage to the ear caused by pressure changes (barotrauma). What are the signs or symptoms? Symptoms of this condition include:  Ear pain.  A fever.  Decreased hearing.  A headache.  Tiredness (lethargy).  Fluid leaking from the ear.  Ringing in the ear. How is this diagnosed? This condition is diagnosed with a physical exam. During the exam your health care provider will use an instrument called an otoscope to look into your ear and check for redness, swelling, and fluid. He or she will also ask about your symptoms. Your health care provider may also order tests, such as:  A test to check the movement of the eardrum (pneumatic otoscopy). This test is done by squeezing a small amount of air into the ear.  A test that changes air pressure in the middle ear to check how well the eardrum moves and whether the eustachian tube is working (tympanogram). How is this  treated? This condition usually goes away on its own within 3-5 days. But if the condition is caused by a bacteria infection and does not go away own its own, or keeps coming back, your health care provider may:  Prescribe antibiotic medicines to treat the infection.  Prescribe or recommend medicines to control pain. Follow these instructions at home:  Take over-the-counter and prescription medicines only as told by your health care provider.  If you were prescribed an antibiotic medicine, take it as told by your health care provider. Do not stop taking the antibiotic even if you start to feel better.  Keep all follow-up visits as told by your health care provider. This is important. Contact a health care provider if:  You have bleeding from your nose.  There is a lump on your neck.  You are not getting better in 5 days.  You feel worse instead of better. Get help right away if:  You have severe pain that is not controlled with medicine.  You have swelling, redness, or pain around your ear.  You have stiffness in your neck.  A part of your face is paralyzed.  The bone behind your ear (mastoid) is tender when you touch it.  You develop a severe headache. Summary  Otitis media is redness, soreness, and swelling of the middle ear.  This condition usually goes away on its own within 3-5 days.  If the problem does not go away in  3-5 days, your health care provider may prescribe or recommend medicines to treat your symptoms.  If you were prescribed an antibiotic medicine, take it as told by your health care provider. This information is not intended to replace advice given to you by your health care provider. Make sure you discuss any questions you have with your health care provider. Document Revised: 02/03/2017 Document Reviewed: 02/12/2016 Elsevier Patient Education  2020 [employer].

## 2019-12-06 NOTE — Assessment & Plan Note (Signed)
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  Looking back, he has historically been treated with Cefdinir and Augmentin alternating medication for previous infections. His last dose of Cefdinir was July of 2020. Plan to treat today with Augmentin for 7 days.  Recommend wearing ear plugs when in or around water to help prevent introduction of water into the inner ear.  Recommend the use of pro-biotic with antibiotic treatment to help reduce GI upset. Imodium may be used if diarrhea presents and no fever is present.  Follow-up if symptoms worsen or fail to improve.

## 2019-12-06 NOTE — Progress Notes (Signed)
Acute Office Visit  Subjective:    Patient ID: Matthew Marks, male    DOB: 06/17/1975, 44 y.o.   MRN: [id number]  Chief Complaint  Patient presents with  . Otitis Media    R ear pain    HPI Patient is in today for right sided ear pain that started yesterday. He reports that he was at the beach recently and water entered his ear. He has tubes in his ears and has a lifelong history of ear infections. He reports his symptoms are similar to past ear infections.   He endorses right sided ear pain and drainage of fluid from the ear.   He denies fever, chills, body aches, sore throat, or sinus pain/pressure.   Past Medical History:  Diagnosis Date  . H/O chronic ear infection 07/04/2014    Past Surgical History:  Procedure Laterality Date  . left knee surgery ligament tension    . TYMPANOSTOMY TUBE PLACEMENT     as a child and as an adult    Family History  Problem Relation Age of Onset  . Prostate cancer Father   . Hypertension Father   . Diabetes Mother     Social History   Socioeconomic History  . Marital status: Married    Spouse name: Not on file  . Number of children: Not on file  . Years of education: Not on file  . Highest education level: Not on file  Occupational History  . Not on file  Tobacco Use  . Smoking status: Never Smoker  . Smokeless tobacco: Never Used  Substance and Sexual Activity  . Alcohol use: Yes    Alcohol/week: 12.0 standard drinks    Types: 12 Standard drinks or equivalent per week  . Drug use: No  . Sexual activity: Yes    Partners: Female  Other Topics Concern  . Not on file  Social History Narrative  . Not on file   Social Determinants of Health   Financial Resource Strain:   . Difficulty of Paying Living Expenses: Not on file  Food Insecurity:   . Worried About Programme researcher, broadcasting/film/video in the Last Year: Not on file  . Ran Out of Food in the Last Year: Not on file  Transportation Needs:   . Lack of Transportation (Medical): Not  on file  . Lack of Transportation (Non-Medical): Not on file  Physical Activity:   . Days of Exercise per Week: Not on file  . Minutes of Exercise per Session: Not on file  Stress:   . Feeling of Stress : Not on file  Social Connections:   . Frequency of Communication with Friends and Family: Not on file  . Frequency of Social Gatherings with Friends and Family: Not on file  . Attends Religious Services: Not on file  . Active Member of Clubs or Organizations: Not on file  . Attends Banker Meetings: Not on file  . Marital Status: Not on file  Intimate Partner Violence:   . Fear of Current or Ex-Partner: Not on file  . Emotionally Abused: Not on file  . Physically Abused: Not on file  . Sexually Abused: Not on file    Outpatient Medications Prior to Visit  Medication Sig Dispense Refill  . benzonatate (TESSALON) 100 MG capsule Take 1 capsule (100 mg total) by mouth 3 (three) times daily as needed for cough. 20 capsule 0  . cetirizine (ZYRTEC) 10 MG tablet Take 1 tablet (10 mg total) by mouth daily.  30 tablet 0  . sildenafil (REVATIO) 20 MG tablet TAKE TWO TO FOUR TABLETS BY MOUTH ONLY AS NEEDED FOR SEX. 150 tablet 0  . triamcinolone cream (KENALOG) 0.1 % Apply 1 application topically 2 (two) times daily. 30 g 0   No facility-administered medications prior to visit.    Allergies  Allergen Reactions  . Bee Venom Anaphylaxis    Review of Systems Pertinent positive and negatives listed in HPI    Objective:    Physical Exam Vitals and nursing note reviewed.  Constitutional:      Appearance: Normal appearance. He is normal weight.  HENT:     Head: Normocephalic.     Right Ear: External ear normal. Drainage and tenderness present. A PE tube is present.     Left Ear: External ear normal. Drainage present. No tenderness. A PE tube is present.     Ears:     Comments: Right ear reveals clear/yellow drainage present in the ear canal with clear demarcation of TM edges  from presence of PE tube.   Left ear reveals bloody drainage present in the ear canal with clear demarcation of the TM edges from the presence of PE tube.     Nose: Nose normal.  Eyes:     Extraocular Movements: Extraocular movements intact.     Conjunctiva/sclera: Conjunctivae normal.     Pupils: Pupils are equal, round, and reactive to light.  Cardiovascular:     Rate and Rhythm: Normal rate and regular rhythm.  Pulmonary:     Effort: Pulmonary effort is normal.  Musculoskeletal:        General: Normal range of motion.     Cervical back: Normal range of motion. No tenderness.  Skin:    General: Skin is warm and dry.     Capillary Refill: Capillary refill takes less than 2 seconds.  Neurological:     General: No focal deficit present.     Mental Status: He is alert and oriented to person, place, and time.  Psychiatric:        Mood and Affect: Mood normal.        Behavior: Behavior normal.        Thought Content: Thought content normal.        Judgment: Judgment normal.     There were no vitals taken for this visit. Wt Readings from Last 3 Encounters:  10/08/19 167 lb (75.8 kg)  09/01/19 163 lb (73.9 kg)  08/30/19 166 lb (75.3 kg)    Health Maintenance Due  Topic Date Due  . Hepatitis C Screening  Never done  . COVID-19 Vaccine (1) Never done  . INFLUENZA VACCINE  10/06/2019    There are no preventive care reminders to display for this patient.   Lab Results  Component Value Date   TSH 2.69 05/17/2019   Lab Results  Component Value Date   WBC 4.7 05/17/2019   HGB 16.0 05/17/2019   HCT 46.8 05/17/2019   MCV 89.1 05/17/2019   PLT 226 05/17/2019   Lab Results  Component Value Date   NA 139 05/17/2019   K 5.0 05/17/2019   CO2 28 05/17/2019   GLUCOSE 95 05/17/2019   BUN 21 05/17/2019   CREATININE 0.91 05/17/2019   BILITOT 0.6 05/17/2019   ALKPHOS 45 07/10/2014   AST 15 05/17/2019   ALT 14 05/17/2019   PROT 7.2 05/17/2019   ALBUMIN 4.6 07/10/2014    CALCIUM 9.7 05/17/2019   Lab Results  Component Value Date  CHOL 187 05/17/2019   Lab Results  Component Value Date   HDL 50 05/17/2019   Lab Results  Component Value Date   LDLCALC 122 (H) 05/17/2019   Lab Results  Component Value Date   TRIG 62 05/17/2019   Lab Results  Component Value Date   CHOLHDL 3.7 05/17/2019   Lab Results  Component Value Date   HGBA1C 5.4 06/19/2017       Assessment & Plan:   Problem List Items Addressed This Visit      Nervous and Auditory   Acute otitis media - Primary    Symptoms and presentation consistent with otitis media in the setting of 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  Looking back, he has historically been treated with Cefdinir and Augmentin alternating medication for previous infections. His last dose of Cefdinir was July of 2020. Plan to treat today with Augmentin for 7 days.  Recommend wearing ear plugs when in or around water to help prevent introduction of water into the inner ear.  Recommend the use of pro-biotic with antibiotic treatment to help reduce GI upset. Imodium may be used if diarrhea presents and no fever is present.  Follow-up if symptoms worsen or fail to improve.       Relevant Medications   amoxicillin-clavulanate (AUGMENTIN) 875-125 MG tablet       Meds ordered this encounter  Medications  . amoxicillin-clavulanate (AUGMENTIN) 875-125 MG tablet    Sig: Take 1 tablet by mouth 2 (two) times daily.    Dispense:  14 tablet    Refill:  0   Return if symptoms worsen or fail to improve.   Tollie Eth, NP

## 2019-12-06 NOTE — Progress Notes (Signed)
Pt reports that he has a Hx of ear infections. He has a hole in his ear from a previous tube.  He was at the beach last week and got some water in his ear

## 2019-12-09 DIAGNOSIS — T63461A Toxic effect of venom of wasps, accidental (unintentional), initial encounter: Secondary | ICD-10-CM | POA: Diagnosis not present

## 2019-12-09 DIAGNOSIS — T63451A Toxic effect of venom of hornets, accidental (unintentional), initial encounter: Secondary | ICD-10-CM | POA: Diagnosis not present

## 2019-12-10 DIAGNOSIS — L918 Other hypertrophic disorders of the skin: Secondary | ICD-10-CM | POA: Diagnosis not present

## 2019-12-10 DIAGNOSIS — L570 Actinic keratosis: Secondary | ICD-10-CM | POA: Diagnosis not present

## 2019-12-13 ENCOUNTER — Other Ambulatory Visit: Payer: Self-pay | Admitting: Nurse Practitioner

## 2019-12-13 ENCOUNTER — Encounter: Payer: Self-pay | Admitting: Nurse Practitioner

## 2019-12-13 DIAGNOSIS — H6690 Otitis media, unspecified, unspecified ear: Secondary | ICD-10-CM

## 2019-12-13 MED ORDER — AMOXICILLIN-POT CLAVULANATE 875-125 MG PO TABS: 1.0000 | ORAL_TABLET | Freq: Two times a day (BID) | ORAL | 0 refills | Status: DC

## 2019-12-13 NOTE — Progress Notes (Signed)
Error

## 2019-12-16 ENCOUNTER — Other Ambulatory Visit: Payer: Self-pay | Admitting: Nurse Practitioner

## 2019-12-16 DIAGNOSIS — T63461A Toxic effect of venom of wasps, accidental (unintentional), initial encounter: Secondary | ICD-10-CM | POA: Diagnosis not present

## 2019-12-16 DIAGNOSIS — T63451A Toxic effect of venom of hornets, accidental (unintentional), initial encounter: Secondary | ICD-10-CM | POA: Diagnosis not present

## 2019-12-16 DIAGNOSIS — H60501 Unspecified acute noninfective otitis externa, right ear: Secondary | ICD-10-CM

## 2019-12-16 MED ORDER — OFLOXACIN 0.3 % OT SOLN: 10.0000 [drp] | Freq: Every day | OTIC | 0 refills | Status: DC

## 2019-12-23 DIAGNOSIS — T63451A Toxic effect of venom of hornets, accidental (unintentional), initial encounter: Secondary | ICD-10-CM | POA: Diagnosis not present

## 2019-12-23 DIAGNOSIS — T63461A Toxic effect of venom of wasps, accidental (unintentional), initial encounter: Secondary | ICD-10-CM | POA: Diagnosis not present

## 2019-12-30 DIAGNOSIS — T63451A Toxic effect of venom of hornets, accidental (unintentional), initial encounter: Secondary | ICD-10-CM | POA: Diagnosis not present

## 2019-12-30 DIAGNOSIS — T63461A Toxic effect of venom of wasps, accidental (unintentional), initial encounter: Secondary | ICD-10-CM | POA: Diagnosis not present

## 2020-01-02 ENCOUNTER — Encounter: Payer: Self-pay | Admitting: Family Medicine

## 2020-01-02 ENCOUNTER — Ambulatory Visit: Payer: Federal, State, Local not specified - PPO | Admitting: Family Medicine

## 2020-01-02 DIAGNOSIS — K645 Perianal venous thrombosis: Secondary | ICD-10-CM | POA: Diagnosis not present

## 2020-01-02 MED ORDER — LIDOCAINE (ANORECTAL) 5 % EX GEL: CUTANEOUS | 1 refills | Status: DC

## 2020-01-02 MED ORDER — HYDROCORTISONE ACETATE 25 MG RE SUPP: 25.0000 mg | Freq: Two times a day (BID) | RECTAL | 0 refills | Status: DC | PRN

## 2020-01-02 NOTE — Patient Instructions (Signed)
If not resolving over the next week let me know.    Hemorrhoids Hemorrhoids are swollen veins in and around the rectum or anus. There are two types of hemorrhoids:  Internal hemorrhoids. These occur in the veins that are just inside the rectum. They may poke through to the outside and become irritated and painful.  External hemorrhoids. These occur in the veins that are outside the anus and can be felt as a painful swelling or hard lump near the anus. Most hemorrhoids do not cause serious problems, and they can be managed with home treatments such as diet and lifestyle changes. If home treatments do not help the symptoms, procedures can be done to shrink or remove the hemorrhoids. What are the causes? This condition is caused by increased pressure in the anal area. This pressure may result from various things, including:  Constipation.  Straining to have a bowel movement.  Diarrhea.  Pregnancy.  Obesity.  Sitting for long periods of time.  Heavy lifting or other activity that causes you to strain.  Anal sex.  Riding a bike for a long period of time. What are the signs or symptoms? Symptoms of this condition include:  Pain.  Anal itching or irritation.  Rectal bleeding.  Leakage of stool (feces).  Anal swelling.  One or more lumps around the anus. How is this diagnosed? This condition can often be diagnosed through a visual exam. Other exams or tests may also be done, such as:  An exam that involves feeling the rectal area with a gloved hand (digital rectal exam).  An exam of the anal canal that is done using a small tube (anoscope).  A blood test, if you have lost a significant amount of blood.  A test to look inside the colon using a flexible tube with a camera on the end (sigmoidoscopy or colonoscopy). How is this treated? This condition can usually be treated at home. However, various procedures may be done if dietary changes, lifestyle changes, and other home  treatments do not help your symptoms. These procedures can help make the hemorrhoids smaller or remove them completely. Some of these procedures involve surgery, and others do not. Common procedures include:  Rubber band ligation. Rubber bands are placed at the base of the hemorrhoids to cut off their blood supply.  Sclerotherapy. Medicine is injected into the hemorrhoids to shrink them.  Infrared coagulation. A type of light energy is used to get rid of the hemorrhoids.  Hemorrhoidectomy surgery. The hemorrhoids are surgically removed, and the veins that supply them are tied off.  Stapled hemorrhoidopexy surgery. The surgeon staples the base of the hemorrhoid to the rectal wall. Follow these instructions at home: Eating and drinking   Eat foods that have a lot of fiber in them, such as whole grains, beans, nuts, fruits, and vegetables.  Ask your health care provider about taking products that have added fiber (fiber supplements).  Reduce the amount of fat in your diet. You can do this by eating low-fat dairy products, eating less red meat, and avoiding processed foods.  Drink enough fluid to keep your urine pale yellow. Managing pain and swelling   Take warm sitz baths for 20 minutes, 3-4 times a day to ease pain and discomfort. You may do this in a bathtub or using a portable sitz bath that fits over the toilet.  If directed, apply ice to the affected area. Using ice packs between sitz baths may be helpful. ? Put ice in a plastic  bag. ? Place a towel between your skin and the bag. ? Leave the ice on for 20 minutes, 2-3 times a day. General instructions  Take over-the-counter and prescription medicines only as told by your health care provider.  Use medicated creams or suppositories as told.  Get regular exercise. Ask your health care provider how much and what kind of exercise is best for you. In general, you should do moderate exercise for at least 30 minutes on most days of  the week (150 minutes each week). This can include activities such as walking, biking, or yoga.  Go to the bathroom when you have the urge to have a bowel movement. Do not wait.  Avoid straining to have bowel movements.  Keep the anal area dry and clean. Use wet toilet paper or moist towelettes after a bowel movement.  Do not sit on the toilet for long periods of time. This increases blood pooling and pain.  Keep all follow-up visits as told by your health care provider. This is important. Contact a health care provider if you have:  Increasing pain and swelling that are not controlled by treatment or medicine.  Difficulty having a bowel movement, or you are unable to have a bowel movement.  Pain or inflammation outside the area of the hemorrhoids. Get help right away if you have:  Uncontrolled bleeding from your rectum. Summary  Hemorrhoids are swollen veins in and around the rectum or anus.  Most hemorrhoids can be managed with home treatments such as diet and lifestyle changes.  Taking warm sitz baths can help ease pain and discomfort.  In severe cases, procedures or surgery can be done to shrink or remove the hemorrhoids. This information is not intended to replace advice given to you by your health care provider. Make sure you discuss any questions you have with your health care provider. Document Revised: 07/20/2018 Document Reviewed: 07/13/2017 Elsevier Patient Education  2020 [employer].

## 2020-01-02 NOTE — Assessment & Plan Note (Signed)
Mild pain and itching.   Excision likely wouldn't speed up resolution given >72 hour duration.  Recommend continued sitz bath Increase dietary fiber and fluid intake.  Start anusol with topical lidocaine as needed.   If not improving over the next week.

## 2020-01-02 NOTE — Progress Notes (Signed)
Matthew Marks - 44 y.o. male MRN [id number]  Date of birth: March 31, 1975  Subjective Chief Complaint  Patient presents with  . Hemorrhoids    HPI Matthew Marks is a 44 y.o. male here today with complaint of hemorrhoids.  He has had painful hemorrhoids for about 3 weeks now.  He also has itching but no bleeding.  Started after taking augmentin which caused some diarrhea.  He has tried several home remedies including sitz bath, cortisone cream, tucks wipes.   ROS:  A comprehensive ROS was completed and negative except as noted per HPI  Allergies  Allergen Reactions  . Bee Venom Anaphylaxis    Past Medical History:  Diagnosis Date  . H/O chronic ear infection 07/04/2014    Past Surgical History:  Procedure Laterality Date  . left knee surgery ligament tension    . TYMPANOSTOMY TUBE PLACEMENT     as a child and as an adult    Social History   Socioeconomic History  . Marital status: Married    Spouse name: Not on file  . Number of children: Not on file  . Years of education: Not on file  . Highest education level: Not on file  Occupational History  . Not on file  Tobacco Use  . Smoking status: Never Smoker  . Smokeless tobacco: Never Used  Substance and Sexual Activity  . Alcohol use: Yes    Alcohol/week: 12.0 standard drinks    Types: 12 Standard drinks or equivalent per week  . Drug use: No  . Sexual activity: Yes    Partners: Female  Other Topics Concern  . Not on file  Social History Narrative  . Not on file   Social Determinants of Health   Financial Resource Strain:   . Difficulty of Paying Living Expenses: Not on file  Food Insecurity:   . Worried About Programme researcher, broadcasting/film/video in the Last Year: Not on file  . Ran Out of Food in the Last Year: Not on file  Transportation Needs:   . Lack of Transportation (Medical): Not on file  . Lack of Transportation (Non-Medical): Not on file  Physical Activity:   . Days of Exercise per Week: Not on file  . Minutes of  Exercise per Session: Not on file  Stress:   . Feeling of Stress : Not on file  Social Connections:   . Frequency of Communication with Friends and Family: Not on file  . Frequency of Social Gatherings with Friends and Family: Not on file  . Attends Religious Services: Not on file  . Active Member of Clubs or Organizations: Not on file  . Attends Banker Meetings: Not on file  . Marital Status: Not on file    Family History  Problem Relation Age of Onset  . Prostate cancer Father   . Hypertension Father   . Diabetes Mother     Health Maintenance  Topic Date Due  . Hepatitis C Screening  Never done  . COVID-19 Vaccine (1) Never done  . INFLUENZA VACCINE  10/06/2019  . TETANUS/TDAP  03/07/2020  . HIV Screening  Completed     ----------------------------------------------------------------------------------------------------------------------------------------------------------------------------------------------------------------- Physical Exam BP 124/78 (BP Location: Left Arm, Patient Position: Sitting, Cuff Size: Normal)   Pulse 82   Temp 98.3 F (36.8 C)   Wt 167 lb 6.4 oz (75.9 kg)   SpO2 98%   BMI 26.27 kg/m   Physical Exam Constitutional:      Appearance: Normal appearance.  Genitourinary:  Comments: Mildly tender, thrombosed hemorrhoids.   Neurological:     General: No focal deficit present.     Mental Status: He is alert.  Psychiatric:        Mood and Affect: Mood normal.        Behavior: Behavior normal.     ------------------------------------------------------------------------------------------------------------------------------------------------------------------------------------------------------------------- Assessment and Plan  Thrombosed external hemorrhoid Mild pain and itching.   Excision likely wouldn't speed up resolution given >72 hour duration.  Recommend continued sitz bath Increase dietary fiber and fluid intake.   Start anusol with topical lidocaine as needed.   If not improving over the next week.    Meds ordered this encounter  Medications  . hydrocortisone (ANUSOL-HC) 25 MG suppository    Sig: Place 1 suppository (25 mg total) rectally 2 (two) times daily as needed for hemorrhoids or anal itching.    Dispense:  12 suppository    Refill:  0  . Lidocaine, Anorectal, 5 % GEL    Sig: Apply every 6-8 hours as needed.    Dispense:  30 g    Refill:  1    No follow-ups on file.    This visit occurred during the SARS-CoV-2 public health emergency.  Safety protocols were in place, including screening questions prior to the visit, additional usage of staff PPE, and extensive cleaning of exam room while observing appropriate contact time as indicated for disinfecting solutions.

## 2020-01-02 NOTE — Progress Notes (Signed)
Pt states he's tried:  OTC Prep H Hydrocortisone Essential Oils Sitz Bath clobetasol propionate

## 2020-01-05 ENCOUNTER — Other Ambulatory Visit: Payer: Self-pay

## 2020-01-05 DIAGNOSIS — N529 Male erectile dysfunction, unspecified: Secondary | ICD-10-CM

## 2020-01-06 ENCOUNTER — Other Ambulatory Visit: Payer: Self-pay | Admitting: Family Medicine

## 2020-01-06 ENCOUNTER — Encounter: Payer: Self-pay | Admitting: Family Medicine

## 2020-01-06 DIAGNOSIS — T63451A Toxic effect of venom of hornets, accidental (unintentional), initial encounter: Secondary | ICD-10-CM | POA: Diagnosis not present

## 2020-01-06 DIAGNOSIS — T63461A Toxic effect of venom of wasps, accidental (unintentional), initial encounter: Secondary | ICD-10-CM | POA: Diagnosis not present

## 2020-01-06 MED ORDER — HYDROCORTISONE ACETATE 25 MG RE SUPP: 25.0000 mg | Freq: Two times a day (BID) | RECTAL | 0 refills | Status: DC | PRN

## 2020-01-06 MED ORDER — LIDOCAINE 5 % EX OINT: 1.0000 "application " | TOPICAL_OINTMENT | Freq: Four times a day (QID) | CUTANEOUS | 0 refills | Status: DC | PRN

## 2020-01-07 ENCOUNTER — Other Ambulatory Visit: Payer: Self-pay

## 2020-01-07 MED ORDER — LIDOCAINE 5 % EX OINT
1.0000 | TOPICAL_OINTMENT | Freq: Four times a day (QID) | CUTANEOUS | 0 refills | Status: DC | PRN
Start: 2020-01-07 — End: 2021-07-20

## 2020-01-10 ENCOUNTER — Other Ambulatory Visit: Payer: Self-pay

## 2020-01-10 DIAGNOSIS — N529 Male erectile dysfunction, unspecified: Secondary | ICD-10-CM

## 2020-01-13 DIAGNOSIS — T63461A Toxic effect of venom of wasps, accidental (unintentional), initial encounter: Secondary | ICD-10-CM | POA: Diagnosis not present

## 2020-01-13 DIAGNOSIS — T63451A Toxic effect of venom of hornets, accidental (unintentional), initial encounter: Secondary | ICD-10-CM | POA: Diagnosis not present

## 2020-01-14 MED ORDER — SILDENAFIL CITRATE 20 MG PO TABS: ORAL_TABLET | ORAL | 0 refills | Status: DC

## 2020-01-16 MED ORDER — SILDENAFIL CITRATE 20 MG PO TABS: ORAL_TABLET | ORAL | 3 refills | Status: DC

## 2020-01-16 NOTE — Telephone Encounter (Signed)
See note and advise °

## 2020-01-16 NOTE — Addendum Note (Signed)
Addended bySilvio Pate on: 01/16/2020 03:39 PM   Modules accepted: Orders

## 2020-01-20 DIAGNOSIS — T63461A Toxic effect of venom of wasps, accidental (unintentional), initial encounter: Secondary | ICD-10-CM | POA: Diagnosis not present

## 2020-01-20 DIAGNOSIS — T63451A Toxic effect of venom of hornets, accidental (unintentional), initial encounter: Secondary | ICD-10-CM | POA: Diagnosis not present

## 2020-01-27 DIAGNOSIS — T63461A Toxic effect of venom of wasps, accidental (unintentional), initial encounter: Secondary | ICD-10-CM | POA: Diagnosis not present

## 2020-01-27 DIAGNOSIS — T63451A Toxic effect of venom of hornets, accidental (unintentional), initial encounter: Secondary | ICD-10-CM | POA: Diagnosis not present

## 2020-02-03 DIAGNOSIS — T63461A Toxic effect of venom of wasps, accidental (unintentional), initial encounter: Secondary | ICD-10-CM | POA: Diagnosis not present

## 2020-02-03 DIAGNOSIS — T63451A Toxic effect of venom of hornets, accidental (unintentional), initial encounter: Secondary | ICD-10-CM | POA: Diagnosis not present

## 2020-02-10 DIAGNOSIS — T63451A Toxic effect of venom of hornets, accidental (unintentional), initial encounter: Secondary | ICD-10-CM | POA: Diagnosis not present

## 2020-02-10 DIAGNOSIS — T63461A Toxic effect of venom of wasps, accidental (unintentional), initial encounter: Secondary | ICD-10-CM | POA: Diagnosis not present

## 2020-02-17 DIAGNOSIS — T63451A Toxic effect of venom of hornets, accidental (unintentional), initial encounter: Secondary | ICD-10-CM | POA: Diagnosis not present

## 2020-02-17 DIAGNOSIS — T63461A Toxic effect of venom of wasps, accidental (unintentional), initial encounter: Secondary | ICD-10-CM | POA: Diagnosis not present

## 2020-03-02 DIAGNOSIS — T63451A Toxic effect of venom of hornets, accidental (unintentional), initial encounter: Secondary | ICD-10-CM | POA: Diagnosis not present

## 2020-03-02 DIAGNOSIS — T63461A Toxic effect of venom of wasps, accidental (unintentional), initial encounter: Secondary | ICD-10-CM | POA: Diagnosis not present

## 2020-03-16 DIAGNOSIS — T63451D Toxic effect of venom of hornets, accidental (unintentional), subsequent encounter: Secondary | ICD-10-CM | POA: Diagnosis not present

## 2020-03-16 DIAGNOSIS — T63461D Toxic effect of venom of wasps, accidental (unintentional), subsequent encounter: Secondary | ICD-10-CM | POA: Diagnosis not present

## 2020-03-30 DIAGNOSIS — T63461A Toxic effect of venom of wasps, accidental (unintentional), initial encounter: Secondary | ICD-10-CM | POA: Diagnosis not present

## 2020-03-30 DIAGNOSIS — T63451A Toxic effect of venom of hornets, accidental (unintentional), initial encounter: Secondary | ICD-10-CM | POA: Diagnosis not present

## 2020-04-09 ENCOUNTER — Ambulatory Visit: Payer: Federal, State, Local not specified - PPO | Admitting: Sports Medicine

## 2020-04-09 ENCOUNTER — Other Ambulatory Visit: Payer: Self-pay

## 2020-04-09 DIAGNOSIS — M25521 Pain in right elbow: Secondary | ICD-10-CM

## 2020-04-09 MED ORDER — MELOXICAM 15 MG PO TABS: ORAL_TABLET | ORAL | 3 refills | Status: DC

## 2020-04-09 NOTE — Assessment & Plan Note (Signed)
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 We will start conservative again, elbow sleeve, eccentric rehabilitation, meloxicam, he will cut the weight back to about 10 to 15 pounds recurrence. If insufficient improvement after a month we will proceed with a repeat distal biceps injection +/- MRI.

## 2020-04-09 NOTE — Progress Notes (Signed)
    Procedures performed today:    None.  Independent interpretation of notes and tests performed by another provider:   None.  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 We will start conservative again, elbow sleeve, eccentric rehabilitation, meloxicam, he will cut the weight back to about 10 to 15 pounds recurrence. If insufficient improvement after a month we will proceed with a repeat distal biceps injection +/- MRI.    ___________________________________________ Ihor Austin. Benjamin Stain, M.D., ABFM., CAQSM. Primary Care and Sports Medicine [facility]  Adjunct Instructor of Family Medicine  [employer]

## 2020-04-18 ENCOUNTER — Other Ambulatory Visit: Payer: Self-pay | Admitting: Nurse Practitioner

## 2020-04-18 DIAGNOSIS — H60501 Unspecified acute noninfective otitis externa, right ear: Secondary | ICD-10-CM

## 2020-04-27 ENCOUNTER — Encounter: Payer: Federal, State, Local not specified - PPO | Admitting: Family Medicine

## 2020-04-27 DIAGNOSIS — T63451A Toxic effect of venom of hornets, accidental (unintentional), initial encounter: Secondary | ICD-10-CM | POA: Diagnosis not present

## 2020-04-27 DIAGNOSIS — T63461A Toxic effect of venom of wasps, accidental (unintentional), initial encounter: Secondary | ICD-10-CM | POA: Diagnosis not present

## 2020-04-30 ENCOUNTER — Encounter: Payer: Federal, State, Local not specified - PPO | Admitting: Family Medicine

## 2020-05-04 ENCOUNTER — Telehealth: Payer: Self-pay

## 2020-05-04 DIAGNOSIS — Z0000 Encounter for general adult medical examination without abnormal findings: Secondary | ICD-10-CM

## 2020-05-04 DIAGNOSIS — Z13220 Encounter for screening for lipoid disorders: Secondary | ICD-10-CM

## 2020-05-04 NOTE — Telephone Encounter (Signed)
Pt called requesting labs prior to visit this week.

## 2020-05-04 NOTE — Telephone Encounter (Signed)
Pt has been advised that labs have been ordered.

## 2020-05-05 DIAGNOSIS — Z0000 Encounter for general adult medical examination without abnormal findings: Secondary | ICD-10-CM | POA: Diagnosis not present

## 2020-05-05 DIAGNOSIS — L720 Epidermal cyst: Secondary | ICD-10-CM | POA: Diagnosis not present

## 2020-05-05 DIAGNOSIS — L918 Other hypertrophic disorders of the skin: Secondary | ICD-10-CM | POA: Diagnosis not present

## 2020-05-05 DIAGNOSIS — L249 Irritant contact dermatitis, unspecified cause: Secondary | ICD-10-CM | POA: Diagnosis not present

## 2020-05-05 DIAGNOSIS — Z13220 Encounter for screening for lipoid disorders: Secondary | ICD-10-CM | POA: Diagnosis not present

## 2020-05-05 LAB — LIPID PANEL
Cholesterol: 174 mg/dL (ref <200)
HDL: 49 mg/dL (ref >=40)
LDL Cholesterol (Calc): 110 mg/dL (calc) — ABNORMAL HIGH
Non-HDL Cholesterol (Calc): 125 mg/dL (calc) (ref <130)
Total CHOL/HDL Ratio: 3.6 (calc) (ref <5.0)
Triglycerides: 61 mg/dL (ref <150)

## 2020-05-07 ENCOUNTER — Encounter: Payer: Self-pay | Admitting: Family Medicine

## 2020-05-07 ENCOUNTER — Ambulatory Visit (INDEPENDENT_AMBULATORY_CARE_PROVIDER_SITE_OTHER): Payer: Federal, State, Local not specified - PPO | Admitting: Family Medicine

## 2020-05-07 ENCOUNTER — Other Ambulatory Visit: Payer: Self-pay

## 2020-05-07 DIAGNOSIS — Z0000 Encounter for general adult medical examination without abnormal findings: Secondary | ICD-10-CM | POA: Diagnosis not present

## 2020-05-07 NOTE — Patient Instructions (Signed)

## 2020-05-07 NOTE — Progress Notes (Signed)
Matthew Marks - 45 y.o. male MRN [id number]  Date of birth: September 08, 1975  Subjective Chief Complaint  Patient presents with  . Annual Exam    HPI Matthew Marks is a 45 y.o. male here today for annual exam.  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  Has had some pressure in the ears recently.   He has labs recently however only lipid panel was run.  Lab contacted and told by lab that he refused additional labs.  He denies this.    He is a non-smoker.  He has 10-12 beers each week.    He says pretty active and feels that his diet is healthy.  ROS:  A comprehensive ROS was completed and negative except as noted per HPI  Allergies  Allergen Reactions  . Bee Venom Anaphylaxis    Past Medical History:  Diagnosis Date  . H/O chronic ear infection 07/04/2014    Past Surgical History:  Procedure Laterality Date  . left knee surgery ligament tension    . TYMPANOSTOMY TUBE PLACEMENT     as a child and as an adult    Social History   Socioeconomic History  . Marital status: Married    Spouse name: Not on file  . Number of children: Not on file  . Years of education: Not on file  . Highest education level: Not on file  Occupational History  . Not on file  Tobacco Use  . Smoking status: Never Smoker  . Smokeless tobacco: Never Used  Substance and Sexual Activity  . Alcohol use: Yes    Alcohol/week: 12.0 standard drinks    Types: 12 Standard drinks or equivalent per week  . Drug use: No  . Sexual activity: Yes    Partners: Female  Other Topics Concern  . Not on file  Social History Narrative  . Not on file   Social Determinants of Health   Financial Resource Strain: Not on file  Food Insecurity: Not on file  Transportation Needs: Not on file  Physical Activity: Not on file  Stress: Not on file  Social Connections: Not on file    Family History  Problem Relation Age of Onset  . Prostate cancer Father   . Hypertension Father   . Diabetes Mother      Health Maintenance  Topic Date Due  . Hepatitis C Screening  Never done  . COLONOSCOPY (Pts 45-84yrs Insurance coverage will need to be confirmed)  Never done  . TETANUS/TDAP  03/07/2020  . INFLUENZA VACCINE  06/04/2020 (Originally 10/06/2019)  . COVID-19 Vaccine (3 - Booster for Moderna series) 06/24/2020  . HIV Screening  Completed  . HPV VACCINES  Aged Out     ----------------------------------------------------------------------------------------------------------------------------------------------------------------------------------------------------------------- Physical Exam BP 127/76 (BP Location: Left Arm, Patient Position: Sitting, Cuff Size: Normal)   Pulse 77   Temp 97.7 F (36.5 C)   Wt 170 lb 14.4 oz (77.5 kg)   SpO2 98%   BMI 26.82 kg/m   Physical Exam Constitutional:      General: He is not in acute distress.    Appearance: Normal appearance. He is well-nourished.  HENT:     Head: Normocephalic and atraumatic.     Right Ear: External ear normal.     Left Ear: External ear normal.     Ears:     Comments: PE tube in place R ear.      Mouth/Throat:     Mouth: Oropharynx is clear and moist.  Eyes:     General: No scleral icterus. Neck:     Thyroid: No thyromegaly.  Cardiovascular:     Rate and Rhythm: Normal rate and regular rhythm.     Pulses: Intact distal pulses.     Heart sounds: Normal heart sounds.  Pulmonary:     Effort: Pulmonary effort is normal.     Breath sounds: Normal breath sounds.  Abdominal:     General: Bowel sounds are normal. There is no distension.     Palpations: Abdomen is soft.     Tenderness: There is no abdominal tenderness. There is no guarding.  Musculoskeletal:        General: No edema.     Cervical back: Normal range of motion and neck supple.  Lymphadenopathy:     Cervical: No cervical adenopathy.  Skin:    General: Skin is warm and dry.     Findings: No rash.  Neurological:     Mental Status: He is alert and  oriented to person, place, and time.     Cranial Nerves: No cranial nerve deficit.     Motor: No abnormal muscle tone.  Psychiatric:        Mood and Affect: Mood and affect normal.        Behavior: Behavior normal.     ------------------------------------------------------------------------------------------------------------------------------------------------------------------------------------------------------------------- Assessment and Plan  Well adult exam Well adult Lipid panel reviewed with him.  Will find out exact reason why his additional labs were not ran.  Screenings: UTD Immunizations: Declines flu Anticipatory guidance/Risk factor reduction:  Recommendations per AVS.    No orders of the defined types were placed in this encounter.   No follow-ups on file.    This visit occurred during the SARS-CoV-2 public health emergency.  Safety protocols were in place, including screening questions prior to the visit, additional usage of staff PPE, and extensive cleaning of exam room while observing appropriate contact time as indicated for disinfecting solutions.

## 2020-05-07 NOTE — Assessment & Plan Note (Signed)
Well adult Lipid panel reviewed with him.  Will find out exact reason why his additional labs were not ran.  Screenings: UTD Immunizations: Declines flu Anticipatory guidance/Risk factor reduction:  Recommendations per AVS.

## 2020-05-08 ENCOUNTER — Ambulatory Visit: Payer: Federal, State, Local not specified - PPO | Admitting: Sports Medicine

## 2020-05-08 ENCOUNTER — Ambulatory Visit (INDEPENDENT_AMBULATORY_CARE_PROVIDER_SITE_OTHER): Payer: Federal, State, Local not specified - PPO

## 2020-05-08 DIAGNOSIS — M7521 Bicipital tendinitis, right shoulder: Secondary | ICD-10-CM

## 2020-05-08 NOTE — Patient Instructions (Signed)
? ?Platelet-rich plasma is used in musculoskeletal medicine to focus your own body?s ability to ?heal. It has several well-done published randomized control trials (RCT) which demonstrate both ?its effectiveness and safety in many musculoskeletal conditions, including osteoarthritis, ?tendinopathies, and damaged vertebral discs. PRP has been in clinical use since the 1990?s. ?Many people know that platelets form a clot if there is a cut in the skin. It turns out that ?platelets do not only form a clot, they also start the body?s own repair process. When platelets ?activate to form a clot, they also release alpha granules which have hundreds of chemical ?messengers in them that initiate and organize repair to the damaged tissue. Precisely placing ?PRP into the site of injury will initiate the healing process by activating on the damaged ?cartilage or tendon. This is an inflammatory process, and inflammation is the vital first phase of ?Healing. ? ?What to expect and how to prepare for PRP ? ? 2 weeks prior to the procedure: depending on the procedure, you may need to arrange ?for a driver to bring you home. IF you are having a lower extremity procedure, we can provide crutches ?as needed. ? ? 7 days prior to the procedure: Stop taking anti-inflammatory drugs like ibuprofen, ?Naprosyn, Celebrex, or Meloxicam. Let Dr. Lasaro Primm know if you have been taking prednisone or other ?corticosteroids in the last month. ? ? The day before the procedure: thoroughly shower and clean your skin.  ? ? The day of the procedure: Wear loose-fitting clothing like sweatpants or shorts. If you ?are having an upper body procedure wear a top that can button or zip up. ? ?PRP will initiate healing and a productive inflammation, and PRP therapy will make the body ?part treated sore for 4 days to two weeks. Anti-inflammatory drugs (i.e. ibuprofen, Naprosyn, ?Celebrex) and corticosteroids such as prednisone can blunt or stop this process,  so it is ?important to not take any anti-inflammatory drugs for 7 days before getting PRP therapy, or for ?at least three weeks after PRP therapy. Corticosteroid injections can blunt inflammation for 30 ?days, so let us know if you have had one recently. Depending on the body part injected, you ?may be in a sling or on crutches for several days. Just like wringing out a wet dishcloth, if you ?load or tense a tendon or ligament that has just been injected with PRP, some of the PRP ?injected will squish out. By keeping the body part treated relaxed by using a sling (for the ?shoulder or arm) or crutches (for hips and legs) for a few days, the PRP can bind in place and do ?its job.  ? ?You may need a driver to bring you home.  ?Tobacco/nicotine is a potent toxin and its use constricts small blood vessels which are needed for tissue repair.  ?Tobacco/nicotine use will limit the effectiveness of any treatment and stopping tobacco use is one of the single  ?greatest actions you can take to improve your health. Avoid toxins like alcohol, which inhibits and depresses the ?cells needed for tissue repair. ? ?What happens during the PRP procedure? ? ?Platelet rich plasma is made by taking some of your blood and performing a two-stage ?centrifuge process on it to concentrate the PRP. First, your blood is drawn into a syringe with a ?small amount of anti-coagulant in it (this is to keep the blood from clotting during this process). ?The amount of blood drawn is usually about 10-30 milliliters, depending on how much PRP is needed for   the treatment.  ?(There are 355 milliliters in a 12-ounce soda can for comparison).  ?Then the blood is transferred in a sterile fashion into a ?centrifuge tube. It is then centrifuged for the first cycle where the red blood cells are isolated ?and discarded. In the second centrifuge cycle, the platelet-rich fraction of the remaining plasma ?is concentrated and placed in a syringe. The skin at the  injection site is numbed with a small ?amount of topical cooling spray. Dr Revanth Neidig will then precisely inject the PRP ?into the injury site using ultrasound guidance. ? ?What to do after your procedure ? ?I will give you specific medicine to control any discomfort you may have after the procedure. ?Avoid NSAIDs like ibuprofen. ?Acetaminophen can be used for mild pain.  ?Depending on the part of the body ?treated, usually you will be placed in a sling or on crutches for 1 to 3 days. Do your best not to ?tense or load the treated area during this time. After 3 days, unless otherwise instructed, the ?treated body part should be used and slowly moved through its full range of motion. It will be ?sore, but you will not be doing damage by moving it, in fact it needs to move to heal. If you ?were on crutches for a period of time, walking is ok once you are off the crutches. For now, ?avoid activities that specifically hurt you before being treated. Exercise is vital to good health ?and finding a way to cross train around your injury is important not only for your physical ?health, but for your mental health as well. Ask me about cross training options for your injury. ?Some brief (10 minutes or less) period of heat or ice therapy will not hurt the therapy, but it is ?not required. Usually, depending on the initial injury, physical therapy is started from two ?weeks to four weeks after injection. Improvements in pain and function should be expected ?from 8 weeks to 12 weeks after injection and some injuries may require more than one ?treatment.  ? ? ?___________________________________________ ?Maddelynn Moosman J. Raelie Lohr, M.D., ABFM., CAQSM. ?Primary Care and Sports Medicine ?[facility] ? ?Adjunct Professor of Family Medicine  ?[employer] ? ?

## 2020-05-08 NOTE — Assessment & Plan Note (Signed)
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 We did a distal biceps injection sometime last year with good efficacy, unfortunately having recurrence of discomfort. Today we performed a repeat biceps injection, we will shut him down for about 4 days. He does need to take care of his wife who just had shoulder surgery. Afterwards if he still having discomfort we will do PRP, he understands he will probably be shut down for a couple of weeks after PRP.

## 2020-05-08 NOTE — Progress Notes (Signed)
    Procedures performed today:    Procedure: Real-time Ultrasound Guided injection of the right distal biceps Device: Samsung [id number]  Verbal informed consent obtained.  Time-out conducted.  Noted no overlying erythema, induration, or other signs of local infection.  Skin prepped in a sterile fashion.  Local anesthesia: Topical Ethyl chloride.  With sterile technique and under real time ultrasound guidance:  Noted minimal bicipital tendinosis, 1 cc Kenalog 40, 1 cc lidocaine, 1 cc bupivacaine injected easily from a dorsal approach. Completed without difficulty  Advised to call if fevers/chills, erythema, induration, drainage, or persistent bleeding.  Images permanently stored and available for review in PACS.  Impression: Technically successful ultrasound guided injection.  Independent interpretation of notes and tests performed by another provider:   None.  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 We did a distal biceps injection sometime last year with good efficacy, unfortunately having recurrence of discomfort. Today we performed a repeat biceps injection, we will shut him down for about 4 days. He does need to take care of his wife who just had shoulder surgery. Afterwards if he still having discomfort we will do PRP, he understands he will probably be shut down for a couple of weeks after PRP.     ___________________________________________ Ihor Austin. Benjamin Stain, M.D., ABFM., CAQSM. Primary Care and Sports Medicine [facility]  Adjunct Instructor of Family Medicine  [employer]

## 2020-05-12 DIAGNOSIS — L720 Epidermal cyst: Secondary | ICD-10-CM | POA: Diagnosis not present

## 2020-05-25 DIAGNOSIS — T63461A Toxic effect of venom of wasps, accidental (unintentional), initial encounter: Secondary | ICD-10-CM | POA: Diagnosis not present

## 2020-05-25 DIAGNOSIS — T63451A Toxic effect of venom of hornets, accidental (unintentional), initial encounter: Secondary | ICD-10-CM | POA: Diagnosis not present

## 2020-05-26 DIAGNOSIS — L720 Epidermal cyst: Secondary | ICD-10-CM | POA: Diagnosis not present

## 2020-06-22 DIAGNOSIS — T63461A Toxic effect of venom of wasps, accidental (unintentional), initial encounter: Secondary | ICD-10-CM | POA: Diagnosis not present

## 2020-06-22 DIAGNOSIS — T63451A Toxic effect of venom of hornets, accidental (unintentional), initial encounter: Secondary | ICD-10-CM | POA: Diagnosis not present

## 2020-07-01 ENCOUNTER — Telehealth: Payer: Self-pay

## 2020-07-01 DIAGNOSIS — M7521 Bicipital tendinitis, right shoulder: Secondary | ICD-10-CM

## 2020-07-01 NOTE — Telephone Encounter (Signed)
Othmar called and left a message stating he was wanting [place] to do a prior authorization for the PRP. I am sure a prior authorization will be denied. Do you want [place] to proceed with getting a prior authorization?

## 2020-07-01 NOTE — Telephone Encounter (Signed)
Yes, I think we need to do it since he has asked.

## 2020-07-02 NOTE — Telephone Encounter (Signed)
Patient advised. He will call back later to schedule.

## 2020-07-02 NOTE — Telephone Encounter (Signed)
Noted, okay to schedule.

## 2020-07-02 NOTE — Telephone Encounter (Signed)
No prior authorization is required for CPT code 0232T PRP as long as it is preformed outpatient, per customer rep Lilla Shook.

## 2020-07-20 DIAGNOSIS — T63461A Toxic effect of venom of wasps, accidental (unintentional), initial encounter: Secondary | ICD-10-CM | POA: Diagnosis not present

## 2020-07-20 DIAGNOSIS — T63451A Toxic effect of venom of hornets, accidental (unintentional), initial encounter: Secondary | ICD-10-CM | POA: Diagnosis not present

## 2020-07-27 DIAGNOSIS — T63461D Toxic effect of venom of wasps, accidental (unintentional), subsequent encounter: Secondary | ICD-10-CM | POA: Diagnosis not present

## 2020-07-27 DIAGNOSIS — T63451D Toxic effect of venom of hornets, accidental (unintentional), subsequent encounter: Secondary | ICD-10-CM | POA: Diagnosis not present

## 2020-07-30 DIAGNOSIS — L720 Epidermal cyst: Secondary | ICD-10-CM | POA: Diagnosis not present

## 2020-08-17 DIAGNOSIS — T63461A Toxic effect of venom of wasps, accidental (unintentional), initial encounter: Secondary | ICD-10-CM | POA: Diagnosis not present

## 2020-08-17 DIAGNOSIS — T63451A Toxic effect of venom of hornets, accidental (unintentional), initial encounter: Secondary | ICD-10-CM | POA: Diagnosis not present

## 2020-08-31 DIAGNOSIS — T63451A Toxic effect of venom of hornets, accidental (unintentional), initial encounter: Secondary | ICD-10-CM | POA: Diagnosis not present

## 2020-08-31 DIAGNOSIS — T63461A Toxic effect of venom of wasps, accidental (unintentional), initial encounter: Secondary | ICD-10-CM | POA: Diagnosis not present

## 2020-09-08 DIAGNOSIS — T63451A Toxic effect of venom of hornets, accidental (unintentional), initial encounter: Secondary | ICD-10-CM | POA: Diagnosis not present

## 2020-09-08 DIAGNOSIS — T63461A Toxic effect of venom of wasps, accidental (unintentional), initial encounter: Secondary | ICD-10-CM | POA: Diagnosis not present

## 2020-09-28 ENCOUNTER — Encounter: Payer: Self-pay | Admitting: Family Medicine

## 2020-09-28 ENCOUNTER — Other Ambulatory Visit: Payer: Self-pay | Admitting: Family Medicine

## 2020-09-28 DIAGNOSIS — H60501 Unspecified acute noninfective otitis externa, right ear: Secondary | ICD-10-CM

## 2020-09-28 MED ORDER — OFLOXACIN 0.3 % OT SOLN
10.0000 [drp] | Freq: Every day | OTIC | 0 refills | Status: DC
Start: 2020-09-28 — End: 2021-06-28

## 2020-10-01 NOTE — Progress Notes (Signed)
Acute Office Visit  Subjective:    Patient ID: Matthew Marks, male    DOB: 1975/07/28, 45 y.o.   MRN: [id number]  Chief Complaint  Patient presents with   Ear Fullness    HPI Patient is in today for ear fullness and drainage.   History of tubes and frequent ear infection to R ear. He notified our office on 09/28/20 that he felt some water behind his ear drum and is prone to infections. PCP sent in ofloxacin drop refill for him.   Today he reports several days of having fluid behind his right hear. He was at the beach and did a lot of swimming. He has not had any pain, but has noticed his ear feels full and his hearing is muffled. He tried the antibiotic drops he was given, but thinks he started too late as drainage and fullness is worsening. He is reporting the drainage from the ear is yellow-green and occasionally a little bloody. He denies any fevers, URI symptoms, severe pain, difficulty breathing.     Past Medical History:  Diagnosis Date   H/O chronic ear infection 07/04/2014    Past Surgical History:  Procedure Laterality Date   left knee surgery ligament tension     TYMPANOSTOMY TUBE PLACEMENT     as a child and as an adult    Family History  Problem Relation Age of Onset   Prostate cancer Father    Hypertension Father    Diabetes Mother     Social History   Socioeconomic History   Marital status: Married    Spouse name: Not on file   Number of children: Not on file   Years of education: Not on file   Highest education level: Not on file  Occupational History   Not on file  Tobacco Use   Smoking status: Never   Smokeless tobacco: Never  Substance and Sexual Activity   Alcohol use: Yes    Alcohol/week: 12.0 standard drinks    Types: 12 Standard drinks or equivalent per week   Drug use: No   Sexual activity: Yes    Partners: Female  Other Topics Concern   Not on file  Social History Narrative   Not on file   Social Determinants of Health    Financial Resource Strain: Not on file  Food Insecurity: Not on file  Transportation Needs: Not on file  Physical Activity: Not on file  Stress: Not on file  Social Connections: Not on file  Intimate Partner Violence: Not on file    Outpatient Medications Prior to Visit  Medication Sig Dispense Refill   hydrocortisone (ANUSOL-HC) 25 MG suppository Place 1 suppository (25 mg total) rectally 2 (two) times daily as needed for hemorrhoids or anal itching. 12 suppository 0   lidocaine (XYLOCAINE) 5 % ointment Apply 1 application topically 4 (four) times daily as needed. 50 g 0   meloxicam (MOBIC) 15 MG tablet One tab PO qAM with a meal for 2 weeks, then daily prn pain. 30 tablet 3   ofloxacin (FLOXIN) 0.3 % OTIC solution Place 10 drops into both ears daily. For 7 days. 5 mL 0   sildenafil (REVATIO) 20 MG tablet TAKE TWO TO FOUR TABLETS BY MOUTH ONLY AS NEEDED FOR SEX. 30 tablet 3   triamcinolone cream (KENALOG) 0.1 % Apply 1 application topically 2 (two) times daily. 30 g 0   No facility-administered medications prior to visit.    Allergies  Allergen Reactions   Bee Venom  Anaphylaxis    Review of Systems All review of systems negative except what is listed in the HPI     Objective:    Physical Exam Vitals reviewed.  Constitutional:      Appearance: Normal appearance.  HENT:     Head: Normocephalic and atraumatic.     Right Ear: Drainage present. A PE tube is present. Tympanic membrane is erythematous.     Left Ear: Tympanic membrane normal.     Ears:     Comments: Moderate amount of purulent drainage with scant bleeding    Nose: Nose normal.     Mouth/Throat:     Mouth: Mucous membranes are moist.     Pharynx: Oropharynx is clear.  Musculoskeletal:     Cervical back: Normal range of motion.  Lymphadenopathy:     Cervical: No cervical adenopathy.  Skin:    Capillary Refill: Capillary refill takes less than 2 seconds.     Findings: No rash.  Neurological:      General: No focal deficit present.     Mental Status: He is alert and oriented to person, place, and time. Mental status is at baseline.  Psychiatric:        Mood and Affect: Mood normal.        Behavior: Behavior normal.        Thought Content: Thought content normal.        Judgment: Judgment normal.    BP 130/90   Pulse (!) 59   Temp 97.7 F (36.5 C)   Resp 17   SpO2 100%  Wt Readings from Last 3 Encounters:  05/07/20 170 lb 14.4 oz (77.5 kg)  01/02/20 167 lb 6.4 oz (75.9 kg)  10/08/19 167 lb (75.8 kg)    Health Maintenance Due  Topic Date Due   Pneumococcal Vaccine 49-64 Years old (1 - PCV) Never done   Hepatitis C Screening  Never done   COLONOSCOPY (Pts 45-80yrs Insurance coverage will need to be confirmed)  Never done   TETANUS/TDAP  03/07/2020   COVID-19 Vaccine (3 - Booster for Moderna series) 05/24/2020    There are no preventive care reminders to display for this patient.   Lab Results  Component Value Date   TSH 2.69 05/17/2019   Lab Results  Component Value Date   WBC 4.7 05/17/2019   HGB 16.0 05/17/2019   HCT 46.8 05/17/2019   MCV 89.1 05/17/2019   PLT 226 05/17/2019   Lab Results  Component Value Date   NA 139 05/17/2019   K 5.0 05/17/2019   CO2 28 05/17/2019   GLUCOSE 95 05/17/2019   BUN 21 05/17/2019   CREATININE 0.91 05/17/2019   BILITOT 0.6 05/17/2019   ALKPHOS 45 07/10/2014   AST 15 05/17/2019   ALT 14 05/17/2019   PROT 7.2 05/17/2019   ALBUMIN 4.6 07/10/2014   CALCIUM 9.7 05/17/2019   Lab Results  Component Value Date   CHOL 174 05/05/2020   Lab Results  Component Value Date   HDL 49 05/05/2020   Lab Results  Component Value Date   LDLCALC 110 (H) 05/05/2020   Lab Results  Component Value Date   TRIG 61 05/05/2020   Lab Results  Component Value Date   CHOLHDL 3.6 05/05/2020   Lab Results  Component Value Date   HGBA1C 5.4 06/19/2017       Assessment & Plan:   1. Acute suppurative otitis media of right ear  without spontaneous rupture of tympanic membrane, recurrence not  specified Given significant presentation will go ahead and do 10-days Augmentin. Encouraged he take this with food and use a probiotic during this time as well. Patient aware of signs/symptoms requiring further/urgent evaluation.   - amoxicillin-clavulanate (AUGMENTIN) 875-125 MG tablet; Take 1 tablet by mouth 2 (two) times daily for 10 days.  Dispense: 20 tablet; Refill: 0   Follow-up after the weekend if not improving or as needed.   Lollie Marrow Reola Calkins, DNP, FNP-C

## 2020-10-02 ENCOUNTER — Other Ambulatory Visit: Payer: Self-pay

## 2020-10-02 ENCOUNTER — Encounter: Payer: Self-pay | Admitting: Family Medicine

## 2020-10-02 ENCOUNTER — Ambulatory Visit: Payer: Federal, State, Local not specified - PPO | Admitting: Family Medicine

## 2020-10-02 VITALS — BP 130/90 | HR 59 | Temp 97.7°F | Resp 17

## 2020-10-02 DIAGNOSIS — H66001 Acute suppurative otitis media without spontaneous rupture of ear drum, right ear: Secondary | ICD-10-CM

## 2020-10-02 MED ORDER — AMOXICILLIN-POT CLAVULANATE 875-125 MG PO TABS
1.0000 | ORAL_TABLET | Freq: Two times a day (BID) | ORAL | 0 refills | Status: AC
Start: 2020-10-02 — End: 2020-10-12

## 2020-10-06 DIAGNOSIS — T63451A Toxic effect of venom of hornets, accidental (unintentional), initial encounter: Secondary | ICD-10-CM | POA: Diagnosis not present

## 2020-10-06 DIAGNOSIS — T63461A Toxic effect of venom of wasps, accidental (unintentional), initial encounter: Secondary | ICD-10-CM | POA: Diagnosis not present

## 2020-10-14 DIAGNOSIS — M4728 Other spondylosis with radiculopathy, sacral and sacrococcygeal region: Secondary | ICD-10-CM | POA: Diagnosis not present

## 2020-10-14 DIAGNOSIS — M79671 Pain in right foot: Secondary | ICD-10-CM | POA: Diagnosis not present

## 2020-10-14 DIAGNOSIS — M4727 Other spondylosis with radiculopathy, lumbosacral region: Secondary | ICD-10-CM | POA: Diagnosis not present

## 2020-10-14 DIAGNOSIS — M47812 Spondylosis without myelopathy or radiculopathy, cervical region: Secondary | ICD-10-CM | POA: Diagnosis not present

## 2020-10-15 DIAGNOSIS — M4727 Other spondylosis with radiculopathy, lumbosacral region: Secondary | ICD-10-CM | POA: Diagnosis not present

## 2020-10-15 DIAGNOSIS — M79671 Pain in right foot: Secondary | ICD-10-CM | POA: Diagnosis not present

## 2020-10-15 DIAGNOSIS — M47812 Spondylosis without myelopathy or radiculopathy, cervical region: Secondary | ICD-10-CM | POA: Diagnosis not present

## 2020-10-15 DIAGNOSIS — M4728 Other spondylosis with radiculopathy, sacral and sacrococcygeal region: Secondary | ICD-10-CM | POA: Diagnosis not present

## 2020-10-20 DIAGNOSIS — M79671 Pain in right foot: Secondary | ICD-10-CM | POA: Diagnosis not present

## 2020-10-20 DIAGNOSIS — M47812 Spondylosis without myelopathy or radiculopathy, cervical region: Secondary | ICD-10-CM | POA: Diagnosis not present

## 2020-10-20 DIAGNOSIS — M4727 Other spondylosis with radiculopathy, lumbosacral region: Secondary | ICD-10-CM | POA: Diagnosis not present

## 2020-10-20 DIAGNOSIS — M4728 Other spondylosis with radiculopathy, sacral and sacrococcygeal region: Secondary | ICD-10-CM | POA: Diagnosis not present

## 2020-10-21 DIAGNOSIS — M4728 Other spondylosis with radiculopathy, sacral and sacrococcygeal region: Secondary | ICD-10-CM | POA: Diagnosis not present

## 2020-10-21 DIAGNOSIS — M4727 Other spondylosis with radiculopathy, lumbosacral region: Secondary | ICD-10-CM | POA: Diagnosis not present

## 2020-10-21 DIAGNOSIS — M47812 Spondylosis without myelopathy or radiculopathy, cervical region: Secondary | ICD-10-CM | POA: Diagnosis not present

## 2020-10-21 DIAGNOSIS — M79671 Pain in right foot: Secondary | ICD-10-CM | POA: Diagnosis not present

## 2020-10-22 DIAGNOSIS — M4728 Other spondylosis with radiculopathy, sacral and sacrococcygeal region: Secondary | ICD-10-CM | POA: Diagnosis not present

## 2020-10-22 DIAGNOSIS — M79671 Pain in right foot: Secondary | ICD-10-CM | POA: Diagnosis not present

## 2020-10-22 DIAGNOSIS — M4727 Other spondylosis with radiculopathy, lumbosacral region: Secondary | ICD-10-CM | POA: Diagnosis not present

## 2020-10-22 DIAGNOSIS — M47812 Spondylosis without myelopathy or radiculopathy, cervical region: Secondary | ICD-10-CM | POA: Diagnosis not present

## 2020-10-26 DIAGNOSIS — M4727 Other spondylosis with radiculopathy, lumbosacral region: Secondary | ICD-10-CM | POA: Diagnosis not present

## 2020-10-26 DIAGNOSIS — M4728 Other spondylosis with radiculopathy, sacral and sacrococcygeal region: Secondary | ICD-10-CM | POA: Diagnosis not present

## 2020-10-26 DIAGNOSIS — M79671 Pain in right foot: Secondary | ICD-10-CM | POA: Diagnosis not present

## 2020-10-26 DIAGNOSIS — M47812 Spondylosis without myelopathy or radiculopathy, cervical region: Secondary | ICD-10-CM | POA: Diagnosis not present

## 2020-10-27 DIAGNOSIS — M79671 Pain in right foot: Secondary | ICD-10-CM | POA: Diagnosis not present

## 2020-10-27 DIAGNOSIS — M4727 Other spondylosis with radiculopathy, lumbosacral region: Secondary | ICD-10-CM | POA: Diagnosis not present

## 2020-10-27 DIAGNOSIS — M4728 Other spondylosis with radiculopathy, sacral and sacrococcygeal region: Secondary | ICD-10-CM | POA: Diagnosis not present

## 2020-10-27 DIAGNOSIS — M47812 Spondylosis without myelopathy or radiculopathy, cervical region: Secondary | ICD-10-CM | POA: Diagnosis not present

## 2020-10-28 DIAGNOSIS — M79671 Pain in right foot: Secondary | ICD-10-CM | POA: Diagnosis not present

## 2020-10-28 DIAGNOSIS — M4728 Other spondylosis with radiculopathy, sacral and sacrococcygeal region: Secondary | ICD-10-CM | POA: Diagnosis not present

## 2020-10-28 DIAGNOSIS — M47812 Spondylosis without myelopathy or radiculopathy, cervical region: Secondary | ICD-10-CM | POA: Diagnosis not present

## 2020-10-28 DIAGNOSIS — M4727 Other spondylosis with radiculopathy, lumbosacral region: Secondary | ICD-10-CM | POA: Diagnosis not present

## 2020-10-29 DIAGNOSIS — M4728 Other spondylosis with radiculopathy, sacral and sacrococcygeal region: Secondary | ICD-10-CM | POA: Diagnosis not present

## 2020-10-29 DIAGNOSIS — M79671 Pain in right foot: Secondary | ICD-10-CM | POA: Diagnosis not present

## 2020-10-29 DIAGNOSIS — M47812 Spondylosis without myelopathy or radiculopathy, cervical region: Secondary | ICD-10-CM | POA: Diagnosis not present

## 2020-10-29 DIAGNOSIS — M4727 Other spondylosis with radiculopathy, lumbosacral region: Secondary | ICD-10-CM | POA: Diagnosis not present

## 2020-11-03 DIAGNOSIS — T63461A Toxic effect of venom of wasps, accidental (unintentional), initial encounter: Secondary | ICD-10-CM | POA: Diagnosis not present

## 2020-11-03 DIAGNOSIS — T63451A Toxic effect of venom of hornets, accidental (unintentional), initial encounter: Secondary | ICD-10-CM | POA: Diagnosis not present

## 2020-11-03 DIAGNOSIS — M79671 Pain in right foot: Secondary | ICD-10-CM | POA: Diagnosis not present

## 2020-11-03 DIAGNOSIS — M47812 Spondylosis without myelopathy or radiculopathy, cervical region: Secondary | ICD-10-CM | POA: Diagnosis not present

## 2020-11-03 DIAGNOSIS — M4728 Other spondylosis with radiculopathy, sacral and sacrococcygeal region: Secondary | ICD-10-CM | POA: Diagnosis not present

## 2020-11-03 DIAGNOSIS — M4727 Other spondylosis with radiculopathy, lumbosacral region: Secondary | ICD-10-CM | POA: Diagnosis not present

## 2020-11-04 DIAGNOSIS — M4728 Other spondylosis with radiculopathy, sacral and sacrococcygeal region: Secondary | ICD-10-CM | POA: Diagnosis not present

## 2020-11-04 DIAGNOSIS — M79671 Pain in right foot: Secondary | ICD-10-CM | POA: Diagnosis not present

## 2020-11-04 DIAGNOSIS — M47812 Spondylosis without myelopathy or radiculopathy, cervical region: Secondary | ICD-10-CM | POA: Diagnosis not present

## 2020-11-04 DIAGNOSIS — M4727 Other spondylosis with radiculopathy, lumbosacral region: Secondary | ICD-10-CM | POA: Diagnosis not present

## 2020-11-10 DIAGNOSIS — M4727 Other spondylosis with radiculopathy, lumbosacral region: Secondary | ICD-10-CM | POA: Diagnosis not present

## 2020-11-10 DIAGNOSIS — M4728 Other spondylosis with radiculopathy, sacral and sacrococcygeal region: Secondary | ICD-10-CM | POA: Diagnosis not present

## 2020-11-10 DIAGNOSIS — M47812 Spondylosis without myelopathy or radiculopathy, cervical region: Secondary | ICD-10-CM | POA: Diagnosis not present

## 2020-11-10 DIAGNOSIS — M79671 Pain in right foot: Secondary | ICD-10-CM | POA: Diagnosis not present

## 2020-11-19 DIAGNOSIS — M79671 Pain in right foot: Secondary | ICD-10-CM | POA: Diagnosis not present

## 2020-11-19 DIAGNOSIS — M47812 Spondylosis without myelopathy or radiculopathy, cervical region: Secondary | ICD-10-CM | POA: Diagnosis not present

## 2020-11-19 DIAGNOSIS — M4727 Other spondylosis with radiculopathy, lumbosacral region: Secondary | ICD-10-CM | POA: Diagnosis not present

## 2020-11-19 DIAGNOSIS — M4728 Other spondylosis with radiculopathy, sacral and sacrococcygeal region: Secondary | ICD-10-CM | POA: Diagnosis not present

## 2020-11-23 DIAGNOSIS — M79671 Pain in right foot: Secondary | ICD-10-CM | POA: Diagnosis not present

## 2020-11-23 DIAGNOSIS — M4727 Other spondylosis with radiculopathy, lumbosacral region: Secondary | ICD-10-CM | POA: Diagnosis not present

## 2020-11-23 DIAGNOSIS — M4728 Other spondylosis with radiculopathy, sacral and sacrococcygeal region: Secondary | ICD-10-CM | POA: Diagnosis not present

## 2020-11-23 DIAGNOSIS — M47812 Spondylosis without myelopathy or radiculopathy, cervical region: Secondary | ICD-10-CM | POA: Diagnosis not present

## 2020-11-24 DIAGNOSIS — L820 Inflamed seborrheic keratosis: Secondary | ICD-10-CM | POA: Diagnosis not present

## 2020-11-24 DIAGNOSIS — L720 Epidermal cyst: Secondary | ICD-10-CM | POA: Diagnosis not present

## 2020-11-25 DIAGNOSIS — M79671 Pain in right foot: Secondary | ICD-10-CM | POA: Diagnosis not present

## 2020-11-25 DIAGNOSIS — M47812 Spondylosis without myelopathy or radiculopathy, cervical region: Secondary | ICD-10-CM | POA: Diagnosis not present

## 2020-11-25 DIAGNOSIS — M4727 Other spondylosis with radiculopathy, lumbosacral region: Secondary | ICD-10-CM | POA: Diagnosis not present

## 2020-11-25 DIAGNOSIS — M4728 Other spondylosis with radiculopathy, sacral and sacrococcygeal region: Secondary | ICD-10-CM | POA: Diagnosis not present

## 2020-11-26 DIAGNOSIS — M4728 Other spondylosis with radiculopathy, sacral and sacrococcygeal region: Secondary | ICD-10-CM | POA: Diagnosis not present

## 2020-11-26 DIAGNOSIS — M4727 Other spondylosis with radiculopathy, lumbosacral region: Secondary | ICD-10-CM | POA: Diagnosis not present

## 2020-11-26 DIAGNOSIS — M79671 Pain in right foot: Secondary | ICD-10-CM | POA: Diagnosis not present

## 2020-11-26 DIAGNOSIS — M47812 Spondylosis without myelopathy or radiculopathy, cervical region: Secondary | ICD-10-CM | POA: Diagnosis not present

## 2020-12-01 DIAGNOSIS — T63461A Toxic effect of venom of wasps, accidental (unintentional), initial encounter: Secondary | ICD-10-CM | POA: Diagnosis not present

## 2020-12-01 DIAGNOSIS — T63451A Toxic effect of venom of hornets, accidental (unintentional), initial encounter: Secondary | ICD-10-CM | POA: Diagnosis not present

## 2020-12-03 DIAGNOSIS — M79671 Pain in right foot: Secondary | ICD-10-CM | POA: Diagnosis not present

## 2020-12-03 DIAGNOSIS — M4728 Other spondylosis with radiculopathy, sacral and sacrococcygeal region: Secondary | ICD-10-CM | POA: Diagnosis not present

## 2020-12-03 DIAGNOSIS — M4727 Other spondylosis with radiculopathy, lumbosacral region: Secondary | ICD-10-CM | POA: Diagnosis not present

## 2020-12-03 DIAGNOSIS — M47812 Spondylosis without myelopathy or radiculopathy, cervical region: Secondary | ICD-10-CM | POA: Diagnosis not present

## 2020-12-09 DIAGNOSIS — M4728 Other spondylosis with radiculopathy, sacral and sacrococcygeal region: Secondary | ICD-10-CM | POA: Diagnosis not present

## 2020-12-09 DIAGNOSIS — M4727 Other spondylosis with radiculopathy, lumbosacral region: Secondary | ICD-10-CM | POA: Diagnosis not present

## 2020-12-09 DIAGNOSIS — M47812 Spondylosis without myelopathy or radiculopathy, cervical region: Secondary | ICD-10-CM | POA: Diagnosis not present

## 2020-12-09 DIAGNOSIS — M79671 Pain in right foot: Secondary | ICD-10-CM | POA: Diagnosis not present

## 2020-12-16 DIAGNOSIS — T63451D Toxic effect of venom of hornets, accidental (unintentional), subsequent encounter: Secondary | ICD-10-CM | POA: Diagnosis not present

## 2020-12-16 DIAGNOSIS — T63461D Toxic effect of venom of wasps, accidental (unintentional), subsequent encounter: Secondary | ICD-10-CM | POA: Diagnosis not present

## 2020-12-29 DIAGNOSIS — T63451A Toxic effect of venom of hornets, accidental (unintentional), initial encounter: Secondary | ICD-10-CM | POA: Diagnosis not present

## 2020-12-29 DIAGNOSIS — T63461A Toxic effect of venom of wasps, accidental (unintentional), initial encounter: Secondary | ICD-10-CM | POA: Diagnosis not present

## 2021-01-04 ENCOUNTER — Other Ambulatory Visit: Payer: Self-pay | Admitting: Sports Medicine

## 2021-01-04 DIAGNOSIS — M25521 Pain in right elbow: Secondary | ICD-10-CM

## 2021-01-26 DIAGNOSIS — T63451A Toxic effect of venom of hornets, accidental (unintentional), initial encounter: Secondary | ICD-10-CM | POA: Diagnosis not present

## 2021-01-26 DIAGNOSIS — T63461A Toxic effect of venom of wasps, accidental (unintentional), initial encounter: Secondary | ICD-10-CM | POA: Diagnosis not present

## 2021-02-09 DIAGNOSIS — D485 Neoplasm of uncertain behavior of skin: Secondary | ICD-10-CM | POA: Diagnosis not present

## 2021-02-09 DIAGNOSIS — M6740 Ganglion, unspecified site: Secondary | ICD-10-CM | POA: Diagnosis not present

## 2021-02-11 DIAGNOSIS — Z7952 Long term (current) use of systemic steroids: Secondary | ICD-10-CM | POA: Diagnosis not present

## 2021-02-11 DIAGNOSIS — S0990XA Unspecified injury of head, initial encounter: Secondary | ICD-10-CM | POA: Diagnosis not present

## 2021-02-11 DIAGNOSIS — F17210 Nicotine dependence, cigarettes, uncomplicated: Secondary | ICD-10-CM | POA: Diagnosis not present

## 2021-02-11 DIAGNOSIS — R110 Nausea: Secondary | ICD-10-CM | POA: Diagnosis not present

## 2021-02-11 DIAGNOSIS — R42 Dizziness and giddiness: Secondary | ICD-10-CM | POA: Diagnosis not present

## 2021-02-11 DIAGNOSIS — W208XXA Other cause of strike by thrown, projected or falling object, initial encounter: Secondary | ICD-10-CM | POA: Diagnosis not present

## 2021-02-11 DIAGNOSIS — S0101XA Laceration without foreign body of scalp, initial encounter: Secondary | ICD-10-CM | POA: Diagnosis not present

## 2021-02-11 DIAGNOSIS — G8911 Acute pain due to trauma: Secondary | ICD-10-CM | POA: Diagnosis not present

## 2021-02-23 DIAGNOSIS — T63451A Toxic effect of venom of hornets, accidental (unintentional), initial encounter: Secondary | ICD-10-CM | POA: Diagnosis not present

## 2021-02-23 DIAGNOSIS — T63461A Toxic effect of venom of wasps, accidental (unintentional), initial encounter: Secondary | ICD-10-CM | POA: Diagnosis not present

## 2021-02-23 DIAGNOSIS — M6740 Ganglion, unspecified site: Secondary | ICD-10-CM | POA: Diagnosis not present

## 2021-03-19 ENCOUNTER — Other Ambulatory Visit: Payer: Self-pay | Admitting: Family Medicine

## 2021-03-19 DIAGNOSIS — N529 Male erectile dysfunction, unspecified: Secondary | ICD-10-CM

## 2021-03-23 DIAGNOSIS — T63461A Toxic effect of venom of wasps, accidental (unintentional), initial encounter: Secondary | ICD-10-CM | POA: Diagnosis not present

## 2021-03-23 DIAGNOSIS — T63451A Toxic effect of venom of hornets, accidental (unintentional), initial encounter: Secondary | ICD-10-CM | POA: Diagnosis not present

## 2021-03-31 ENCOUNTER — Other Ambulatory Visit: Payer: Self-pay

## 2021-03-31 ENCOUNTER — Ambulatory Visit: Payer: Federal, State, Local not specified - PPO | Admitting: Sports Medicine

## 2021-03-31 ENCOUNTER — Ambulatory Visit (INDEPENDENT_AMBULATORY_CARE_PROVIDER_SITE_OTHER): Payer: Federal, State, Local not specified - PPO

## 2021-03-31 DIAGNOSIS — M5136 Other intervertebral disc degeneration, lumbar region: Secondary | ICD-10-CM

## 2021-03-31 DIAGNOSIS — M545 Low back pain, unspecified: Secondary | ICD-10-CM | POA: Diagnosis not present

## 2021-03-31 DIAGNOSIS — M51369 Other intervertebral disc degeneration, lumbar region without mention of lumbar back pain or lower extremity pain: Secondary | ICD-10-CM

## 2021-03-31 DIAGNOSIS — M549 Dorsalgia, unspecified: Secondary | ICD-10-CM

## 2021-03-31 MED ORDER — PREDNISONE 50 MG PO TABS: ORAL_TABLET | ORAL | 0 refills | Status: DC

## 2021-03-31 NOTE — Assessment & Plan Note (Signed)
This is a pleasant 46 year old male, he has a long history of low back pain, pain is axial, left-sided, discogenic in nature. He did see a chiropractor sometime last year, sounds like he had x-rays done that showed some spondylolisthesis. He had x-rays with [place] about a decade and a half ago that did show some L5-S1 spondylolisthesis and DDD at L5-S1. We went over the anatomy and evolution of lumbar disc disease, we will do 5 days of prednisone, updated x-rays, home physical therapy, he will get a convertible workstation at home and avoid prolonged deep flexion. Return to see me in 6 weeks, MRI for interventional planning if no better.

## 2021-03-31 NOTE — Progress Notes (Signed)
° ° °  Procedures performed today:    None.  Independent interpretation of notes and tests performed by another provider:   None.  Brief History, Exam, Impression, and Recommendations:    DDD (degenerative disc disease), lumbar This is a pleasant 46 year old male, he has a long history of low back pain, pain is axial, left-sided, discogenic in nature. He did see a chiropractor sometime last year, sounds like he had x-rays done that showed some spondylolisthesis. He had x-rays with [place] about a decade and a half ago that did show some L5-S1 spondylolisthesis and DDD at L5-S1. We went over the anatomy and evolution of lumbar disc disease, we will do 5 days of prednisone, updated x-rays, home physical therapy, he will get a convertible workstation at home and avoid prolonged deep flexion. Return to see me in 6 weeks, MRI for interventional planning if no better.    ___________________________________________ Ihor Austin. Benjamin Stain, M.D., ABFM., CAQSM. Primary Care and Sports Medicine [facility]  Adjunct Instructor of Family Medicine  [employer]

## 2021-04-02 ENCOUNTER — Encounter: Payer: Self-pay | Admitting: Sports Medicine

## 2021-04-20 DIAGNOSIS — T63461A Toxic effect of venom of wasps, accidental (unintentional), initial encounter: Secondary | ICD-10-CM | POA: Diagnosis not present

## 2021-04-20 DIAGNOSIS — T63451A Toxic effect of venom of hornets, accidental (unintentional), initial encounter: Secondary | ICD-10-CM | POA: Diagnosis not present

## 2021-05-07 DIAGNOSIS — T63461D Toxic effect of venom of wasps, accidental (unintentional), subsequent encounter: Secondary | ICD-10-CM | POA: Diagnosis not present

## 2021-05-07 DIAGNOSIS — T63451D Toxic effect of venom of hornets, accidental (unintentional), subsequent encounter: Secondary | ICD-10-CM | POA: Diagnosis not present

## 2021-05-12 ENCOUNTER — Ambulatory Visit: Payer: Federal, State, Local not specified - PPO | Admitting: Sports Medicine

## 2021-05-18 DIAGNOSIS — T63461A Toxic effect of venom of wasps, accidental (unintentional), initial encounter: Secondary | ICD-10-CM | POA: Diagnosis not present

## 2021-05-18 DIAGNOSIS — T63451A Toxic effect of venom of hornets, accidental (unintentional), initial encounter: Secondary | ICD-10-CM | POA: Diagnosis not present

## 2021-05-25 DIAGNOSIS — L820 Inflamed seborrheic keratosis: Secondary | ICD-10-CM | POA: Diagnosis not present

## 2021-05-25 DIAGNOSIS — R21 Rash and other nonspecific skin eruption: Secondary | ICD-10-CM | POA: Diagnosis not present

## 2021-05-25 DIAGNOSIS — Z129 Encounter for screening for malignant neoplasm, site unspecified: Secondary | ICD-10-CM | POA: Diagnosis not present

## 2021-05-25 DIAGNOSIS — L603 Nail dystrophy: Secondary | ICD-10-CM | POA: Diagnosis not present

## 2021-05-25 DIAGNOSIS — B351 Tinea unguium: Secondary | ICD-10-CM | POA: Diagnosis not present

## 2021-06-15 DIAGNOSIS — T63451A Toxic effect of venom of hornets, accidental (unintentional), initial encounter: Secondary | ICD-10-CM | POA: Diagnosis not present

## 2021-06-15 DIAGNOSIS — T63461A Toxic effect of venom of wasps, accidental (unintentional), initial encounter: Secondary | ICD-10-CM | POA: Diagnosis not present

## 2021-06-17 DIAGNOSIS — B351 Tinea unguium: Secondary | ICD-10-CM | POA: Diagnosis not present

## 2021-06-17 DIAGNOSIS — Z79899 Other long term (current) drug therapy: Secondary | ICD-10-CM | POA: Diagnosis not present

## 2021-06-27 ENCOUNTER — Other Ambulatory Visit: Payer: Self-pay | Admitting: Family Medicine

## 2021-06-27 DIAGNOSIS — H60501 Unspecified acute noninfective otitis externa, right ear: Secondary | ICD-10-CM

## 2021-06-29 ENCOUNTER — Other Ambulatory Visit: Payer: Self-pay

## 2021-06-29 ENCOUNTER — Other Ambulatory Visit: Payer: Self-pay | Admitting: Family Medicine

## 2021-06-29 ENCOUNTER — Encounter: Payer: Self-pay | Admitting: Family Medicine

## 2021-06-29 DIAGNOSIS — H60501 Unspecified acute noninfective otitis externa, right ear: Secondary | ICD-10-CM

## 2021-06-29 DIAGNOSIS — Z13220 Encounter for screening for lipoid disorders: Secondary | ICD-10-CM

## 2021-06-29 DIAGNOSIS — R7301 Impaired fasting glucose: Secondary | ICD-10-CM

## 2021-06-29 DIAGNOSIS — R5383 Other fatigue: Secondary | ICD-10-CM

## 2021-06-29 DIAGNOSIS — Z0000 Encounter for general adult medical examination without abnormal findings: Secondary | ICD-10-CM

## 2021-06-29 NOTE — Progress Notes (Signed)
Orders have been placed by Dr. Ashley Royalty including testosterone.  ? ?Pt is aware and has lab times.  ?

## 2021-07-01 ENCOUNTER — Telehealth: Payer: Self-pay | Admitting: Family Medicine

## 2021-07-01 DIAGNOSIS — Z0000 Encounter for general adult medical examination without abnormal findings: Secondary | ICD-10-CM | POA: Diagnosis not present

## 2021-07-01 DIAGNOSIS — R7301 Impaired fasting glucose: Secondary | ICD-10-CM | POA: Diagnosis not present

## 2021-07-01 DIAGNOSIS — R5383 Other fatigue: Secondary | ICD-10-CM | POA: Diagnosis not present

## 2021-07-01 DIAGNOSIS — Z13220 Encounter for screening for lipoid disorders: Secondary | ICD-10-CM | POA: Diagnosis not present

## 2021-07-01 NOTE — Telephone Encounter (Signed)
All of the pages were available and were scanned into the media tab, I just printed this back out and it can all be sent back off, it will be in my box. ?

## 2021-07-01 NOTE — Telephone Encounter (Signed)
Patient came in to office and dropped off forms for his employer. 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 Patient asked if provider would complete forms and fax to [phone]. Paperwork placed in providers box - lmr ?

## 2021-07-01 NOTE — Telephone Encounter (Signed)
Patient notified thank you

## 2021-07-02 LAB — CBC WITH DIFFERENTIAL/PLATELET
Absolute Monocytes: 371 cells/uL (ref 200–950)
Basophils Absolute: 52 cells/uL (ref 0–200)
Basophils Relative: 1.1 %
Eosinophils Absolute: 99 cells/uL (ref 15–500)
Eosinophils Relative: 2.1 %
HCT: 45.4 % (ref 38.5–50.0)
Hemoglobin: 15.5 g/dL (ref 13.2–17.1)
Lymphs Abs: 1692 cells/uL (ref 850–3900)
MCH: 30.8 pg (ref 27.0–33.0)
MCHC: 34.1 g/dL (ref 32.0–36.0)
MCV: 90.1 fL (ref 80.0–100.0)
MPV: 10.4 fL (ref 7.5–12.5)
Monocytes Relative: 7.9 %
Neutro Abs: 2486 cells/uL (ref 1500–7800)
Neutrophils Relative %: 52.9 %
Platelets: 260 10*3/uL (ref 140–400)
RBC: 5.04 10*6/uL (ref 4.20–5.80)
RDW: 12.1 % (ref 11.0–15.0)
Total Lymphocyte: 36 %
WBC: 4.7 10*3/uL (ref 3.8–10.8)

## 2021-07-02 LAB — COMPLETE METABOLIC PANEL WITH GFR
AG Ratio: 1.5 (calc) (ref 1.0–2.5)
ALT: 11 U/L (ref 9–46)
AST: 13 U/L (ref 10–40)
Albumin: 4.3 g/dL (ref 3.6–5.1)
Alkaline phosphatase (APISO): 43 U/L (ref 36–130)
BUN: 18 mg/dL (ref 7–25)
CO2: 29 mmol/L (ref 20–32)
Calcium: 9.2 mg/dL (ref 8.6–10.3)
Chloride: 105 mmol/L (ref 98–110)
Creat: 1.01 mg/dL (ref 0.60–1.29)
Globulin: 2.8 g/dL (calc) (ref 1.9–3.7)
Glucose, Bld: 102 mg/dL — ABNORMAL HIGH (ref 65–99)
Potassium: 4.5 mmol/L (ref 3.5–5.3)
Sodium: 140 mmol/L (ref 135–146)
Total Bilirubin: 0.7 mg/dL (ref 0.2–1.2)
Total Protein: 7.1 g/dL (ref 6.1–8.1)
eGFR: 92 mL/min/{1.73_m2} (ref >=60)

## 2021-07-02 LAB — LIPID PANEL W/REFLEX DIRECT LDL
Cholesterol: 175 mg/dL (ref <200)
HDL: 52 mg/dL (ref >=40)
LDL Cholesterol (Calc): 109 mg/dL (calc) — ABNORMAL HIGH
Non-HDL Cholesterol (Calc): 123 mg/dL (calc) (ref <130)
Total CHOL/HDL Ratio: 3.4 (calc) (ref <5.0)
Triglycerides: 49 mg/dL (ref <150)

## 2021-07-02 LAB — TESTOSTERONE: Testosterone: 623 ng/dL (ref 250–827)

## 2021-07-02 LAB — HEMOGLOBIN A1C
Hgb A1c MFr Bld: 5.3 % of total Hgb (ref <5.7)
Mean Plasma Glucose: 105 mg/dL
eAG (mmol/L): 5.8 mmol/L

## 2021-07-13 DIAGNOSIS — T63461A Toxic effect of venom of wasps, accidental (unintentional), initial encounter: Secondary | ICD-10-CM | POA: Diagnosis not present

## 2021-07-13 DIAGNOSIS — T63451A Toxic effect of venom of hornets, accidental (unintentional), initial encounter: Secondary | ICD-10-CM | POA: Diagnosis not present

## 2021-07-20 ENCOUNTER — Encounter: Payer: Self-pay | Admitting: Family Medicine

## 2021-07-20 ENCOUNTER — Ambulatory Visit (INDEPENDENT_AMBULATORY_CARE_PROVIDER_SITE_OTHER): Payer: Federal, State, Local not specified - PPO | Admitting: Family Medicine

## 2021-07-20 VITALS — BP 127/86 | HR 63 | Ht 66.93 in | Wt 165.0 lb

## 2021-07-20 DIAGNOSIS — H60501 Unspecified acute noninfective otitis externa, right ear: Secondary | ICD-10-CM

## 2021-07-20 DIAGNOSIS — Z23 Encounter for immunization: Secondary | ICD-10-CM | POA: Diagnosis not present

## 2021-07-20 DIAGNOSIS — Z0000 Encounter for general adult medical examination without abnormal findings: Secondary | ICD-10-CM | POA: Diagnosis not present

## 2021-07-20 MED ORDER — OFLOXACIN 0.3 % OT SOLN: 10.0000 [drp] | Freq: Every day | OTIC | 0 refills | Status: DC

## 2021-07-20 MED ORDER — LIDOCAINE 5 % EX OINT: 1.0000 "application " | TOPICAL_OINTMENT | Freq: Four times a day (QID) | CUTANEOUS | 0 refills | Status: DC | PRN

## 2021-07-20 MED ORDER — HYDROCORTISONE ACETATE 25 MG RE SUPP: 25.0000 mg | Freq: Two times a day (BID) | RECTAL | 0 refills | Status: DC | PRN

## 2021-07-20 NOTE — Assessment & Plan Note (Signed)
Well adult ?Orders Placed This Encounter  ?Procedures  ?? Tdap vaccine greater than or equal to 48yo IM  ?Screenings: UTD ?Immunizations: UTD ?Anticipatory guidance/risk factor reduction:  Recommendations per AVS.   ?

## 2021-07-20 NOTE — Progress Notes (Signed)
?Matthew Marks - 46 y.o. male MRN [id number]  Date of birth: 05-23-1975 ? ?Subjective ?Chief Complaint  ?Patient presents with  ? Annual Exam  ? ? ?HPI ?Matthew Marks is a 46 y.o. male here today for annual exam.  Doing well at this time.  Had labs completed prior to visit today which we reviewed in detail.  Overall these were within normal limits. ? ?He is requesting refills of Anusol and lidocaine to use for hemorrhoid flareups.  He has had some issues with recurrent swimmer's ear.  Ofloxacin tends to work pretty well for this. ? ? ?He continues to spend a vast amount of time outdoors and stays pretty active.  Feels like his diet is pretty healthy. ? ?He is a non-smoker.  He he consumes alcohol occasionally. ? ?Review of Systems  ?Constitutional:  Negative for chills, fever, malaise/fatigue and weight loss.  ?HENT:  Negative for congestion, ear pain and sore throat.   ?Eyes:  Negative for blurred vision, double vision and pain.  ?Respiratory:  Negative for cough and shortness of breath.   ?Cardiovascular:  Negative for chest pain and palpitations.  ?Gastrointestinal:  Negative for abdominal pain, blood in stool, constipation, heartburn and nausea.  ?Genitourinary:  Negative for dysuria and urgency.  ?Musculoskeletal:  Negative for joint pain and myalgias.  ?Neurological:  Negative for dizziness and headaches.  ?Endo/Heme/Allergies:  Does not bruise/bleed easily.  ?Psychiatric/Behavioral:  Negative for depression. The patient is not nervous/anxious and does not have insomnia.   ? ?Allergies  ?Allergen Reactions  ? Bee Venom Anaphylaxis  ? ? ?Past Medical History:  ?Diagnosis Date  ? H/O chronic ear infection 07/04/2014  ? ? ?Past Surgical History:  ?Procedure Laterality Date  ? left knee surgery ligament tension    ? TYMPANOSTOMY TUBE PLACEMENT    ? as a child and as an adult  ? ? ?Social History  ? ?Socioeconomic History  ? Marital status: Married  ?  Spouse name: Not on file  ? Number of children: Not on file  ? Years  of education: Not on file  ? Highest education level: Not on file  ?Occupational History  ? Not on file  ?Tobacco Use  ? Smoking status: Never  ? Smokeless tobacco: Never  ?Substance and Sexual Activity  ? Alcohol use: Yes  ?  Alcohol/week: 12.0 standard drinks  ?  Types: 12 Standard drinks or equivalent per week  ? Drug use: No  ? Sexual activity: Yes  ?  Partners: Female  ?Other Topics Concern  ? Not on file  ?Social History Narrative  ? Not on file  ? ?Social Determinants of Health  ? ?Financial Resource Strain: Not on file  ?Food Insecurity: Not on file  ?Transportation Needs: Not on file  ?Physical Activity: Not on file  ?Stress: Not on file  ?Social Connections: Not on file  ? ? ?Family History  ?Problem Relation Age of Onset  ? Prostate cancer Father   ? Hypertension Father   ? Diabetes Mother   ? ? ?Health Maintenance  ?Topic Date Due  ? Hepatitis C Screening  Never done  ? COLONOSCOPY (Pts 45-62yrs Insurance coverage will need to be confirmed)  Never done  ? COVID-19 Vaccine (3 - Booster for Moderna series) 02/19/2020  ? INFLUENZA VACCINE  10/05/2021  ? TETANUS/TDAP  07/21/2031  ? HIV Screening  Completed  ? HPV VACCINES  Aged Out  ? ? ? ?----------------------------------------------------------------------------------------------------------------------------------------------------------------------------------------------------------------- ?Physical Exam ?There were no vitals taken for this visit. ? ?  Physical Exam ?Constitutional:   ?   General: He is not in acute distress. ?HENT:  ?   Head: Normocephalic and atraumatic.  ?   Right Ear: Tympanic membrane and external ear normal.  ?   Left Ear: Tympanic membrane and external ear normal.  ?Eyes:  ?   General: No scleral icterus. ?Neck:  ?   Thyroid: No thyromegaly.  ?Cardiovascular:  ?   Rate and Rhythm: Normal rate and regular rhythm.  ?   Heart sounds: Normal heart sounds.  ?Pulmonary:  ?   Effort: Pulmonary effort is normal.  ?   Breath sounds: Normal  breath sounds.  ?Abdominal:  ?   General: Bowel sounds are normal. There is no distension.  ?   Palpations: Abdomen is soft.  ?   Tenderness: There is no abdominal tenderness. There is no guarding.  ?Musculoskeletal:  ?   Cervical back: Normal range of motion.  ?Lymphadenopathy:  ?   Cervical: No cervical adenopathy.  ?Skin: ?   General: Skin is warm and dry.  ?   Findings: No rash.  ?Neurological:  ?   Mental Status: He is alert and oriented to person, place, and time.  ?   Cranial Nerves: No cranial nerve deficit.  ?   Motor: No abnormal muscle tone.  ?Psychiatric:     ?   Mood and Affect: Mood normal.     ?   Behavior: Behavior normal.  ? ? ?------------------------------------------------------------------------------------------------------------------------------------------------------------------------------------------------------------------- ?Assessment and Plan ? ?Well adult exam ?Well adult ?Orders Placed This Encounter  ?Procedures  ? Tdap vaccine greater than or equal to 7yo IM  ?Screenings: UTD ?Immunizations: UTD ?Anticipatory guidance/risk factor reduction:  Recommendations per AVS.   ? ? ?Meds ordered this encounter  ?Medications  ? lidocaine (XYLOCAINE) 5 % ointment  ?  Sig: Apply 1 application. topically 4 (four) times daily as needed.  ?  Dispense:  50 g  ?  Refill:  0  ? hydrocortisone (ANUSOL-HC) 25 MG suppository  ?  Sig: Place 1 suppository (25 mg total) rectally 2 (two) times daily as needed for hemorrhoids or anal itching.  ?  Dispense:  12 suppository  ?  Refill:  0  ? ofloxacin (FLOXIN) 0.3 % OTIC solution  ?  Sig: Place 10 drops into both ears daily. For 7 days.  ?  Dispense:  5 mL  ?  Refill:  0  ? ? ?No follow-ups on file. ? ? ? ?This visit occurred during the SARS-CoV-2 public health emergency.  Safety protocols were in place, including screening questions prior to the visit, additional usage of staff PPE, and extensive cleaning of exam room while observing appropriate contact time  as indicated for disinfecting solutions.  ? ?

## 2021-07-20 NOTE — Patient Instructions (Signed)

## 2021-07-21 ENCOUNTER — Encounter: Payer: Self-pay | Admitting: Family Medicine

## 2021-08-10 DIAGNOSIS — T63451A Toxic effect of venom of hornets, accidental (unintentional), initial encounter: Secondary | ICD-10-CM | POA: Diagnosis not present

## 2021-08-10 DIAGNOSIS — T63461A Toxic effect of venom of wasps, accidental (unintentional), initial encounter: Secondary | ICD-10-CM | POA: Diagnosis not present

## 2021-09-08 DIAGNOSIS — T63461A Toxic effect of venom of wasps, accidental (unintentional), initial encounter: Secondary | ICD-10-CM | POA: Diagnosis not present

## 2021-09-08 DIAGNOSIS — T63451A Toxic effect of venom of hornets, accidental (unintentional), initial encounter: Secondary | ICD-10-CM | POA: Diagnosis not present

## 2021-10-11 DIAGNOSIS — Z720 Tobacco use: Secondary | ICD-10-CM | POA: Diagnosis not present

## 2021-10-11 DIAGNOSIS — T63461A Toxic effect of venom of wasps, accidental (unintentional), initial encounter: Secondary | ICD-10-CM | POA: Diagnosis not present

## 2021-10-11 DIAGNOSIS — T63451A Toxic effect of venom of hornets, accidental (unintentional), initial encounter: Secondary | ICD-10-CM | POA: Diagnosis not present

## 2021-10-11 DIAGNOSIS — L500 Allergic urticaria: Secondary | ICD-10-CM | POA: Diagnosis not present

## 2021-10-12 DIAGNOSIS — T63451D Toxic effect of venom of hornets, accidental (unintentional), subsequent encounter: Secondary | ICD-10-CM | POA: Diagnosis not present

## 2021-10-12 DIAGNOSIS — T63461D Toxic effect of venom of wasps, accidental (unintentional), subsequent encounter: Secondary | ICD-10-CM | POA: Diagnosis not present

## 2021-10-20 DIAGNOSIS — T63461A Toxic effect of venom of wasps, accidental (unintentional), initial encounter: Secondary | ICD-10-CM | POA: Diagnosis not present

## 2021-10-20 DIAGNOSIS — T63451A Toxic effect of venom of hornets, accidental (unintentional), initial encounter: Secondary | ICD-10-CM | POA: Diagnosis not present

## 2021-11-17 DIAGNOSIS — T63451A Toxic effect of venom of hornets, accidental (unintentional), initial encounter: Secondary | ICD-10-CM | POA: Diagnosis not present

## 2021-11-17 DIAGNOSIS — T63461A Toxic effect of venom of wasps, accidental (unintentional), initial encounter: Secondary | ICD-10-CM | POA: Diagnosis not present

## 2021-12-15 DIAGNOSIS — T63461A Toxic effect of venom of wasps, accidental (unintentional), initial encounter: Secondary | ICD-10-CM | POA: Diagnosis not present

## 2021-12-15 DIAGNOSIS — T63451A Toxic effect of venom of hornets, accidental (unintentional), initial encounter: Secondary | ICD-10-CM | POA: Diagnosis not present

## 2021-12-18 ENCOUNTER — Other Ambulatory Visit: Payer: Self-pay | Admitting: Family Medicine

## 2021-12-18 DIAGNOSIS — H60501 Unspecified acute noninfective otitis externa, right ear: Secondary | ICD-10-CM

## 2021-12-25 ENCOUNTER — Other Ambulatory Visit: Payer: Self-pay | Admitting: Family Medicine

## 2021-12-25 DIAGNOSIS — N529 Male erectile dysfunction, unspecified: Secondary | ICD-10-CM

## 2021-12-27 NOTE — Telephone Encounter (Signed)
Scheduled for 01/11/22 @ 2:40. - tvt

## 2021-12-27 NOTE — Telephone Encounter (Signed)
Patient needs an appointment for further refills.  Last office visit 07/20/2021  Last filled 03/22/2021  Sent 30 day supply. No refills.

## 2022-01-11 ENCOUNTER — Ambulatory Visit: Payer: Federal, State, Local not specified - PPO | Admitting: Family Medicine

## 2022-01-12 DIAGNOSIS — T63451A Toxic effect of venom of hornets, accidental (unintentional), initial encounter: Secondary | ICD-10-CM | POA: Diagnosis not present

## 2022-01-12 DIAGNOSIS — T63461A Toxic effect of venom of wasps, accidental (unintentional), initial encounter: Secondary | ICD-10-CM | POA: Diagnosis not present

## 2022-01-13 ENCOUNTER — Encounter: Payer: Self-pay | Admitting: Family Medicine

## 2022-01-13 ENCOUNTER — Ambulatory Visit (INDEPENDENT_AMBULATORY_CARE_PROVIDER_SITE_OTHER): Payer: Federal, State, Local not specified - PPO | Admitting: Family Medicine

## 2022-01-13 VITALS — BP 127/75 | HR 84 | Ht 66.93 in | Wt 173.0 lb

## 2022-01-13 DIAGNOSIS — H60501 Unspecified acute noninfective otitis externa, right ear: Secondary | ICD-10-CM

## 2022-01-13 DIAGNOSIS — N529 Male erectile dysfunction, unspecified: Secondary | ICD-10-CM | POA: Diagnosis not present

## 2022-01-13 DIAGNOSIS — Z9622 Myringotomy tube(s) status: Secondary | ICD-10-CM | POA: Diagnosis not present

## 2022-01-13 DIAGNOSIS — R058 Other specified cough: Secondary | ICD-10-CM

## 2022-01-13 MED ORDER — BENZONATATE 200 MG PO CAPS: 200.0000 mg | ORAL_CAPSULE | Freq: Two times a day (BID) | ORAL | 3 refills | Status: DC | PRN

## 2022-01-13 MED ORDER — SILDENAFIL CITRATE 20 MG PO TABS: ORAL_TABLET | ORAL | 6 refills | Status: DC

## 2022-01-13 MED ORDER — OFLOXACIN 0.3 % OT SOLN: 10.0000 [drp] | Freq: Every day | OTIC | 1 refills | Status: DC

## 2022-01-13 NOTE — Assessment & Plan Note (Signed)
Sildenafil renewed.  He is aware of precautions with this.

## 2022-01-13 NOTE — Progress Notes (Signed)
Matthew Marks - 46 y.o. male MRN [id number]  Date of birth: 1975/07/17  Subjective Chief Complaint  Patient presents with   Medication Refill    HPI Matthew Marks is a 46 year old male here today for follow-up visit.  He has history of recurrent otitis media.  He does have tympanostomy tube and ofloxacin drops worked well for him.  He is requesting renewal of this to keep on hand due to recurrent infections. Denies pain at this time.  Had recent respiratory illness and continues to have mild cough.  He is requesting prescription for [employer].  Denies chest pain or shortness of breath.  No fever or chills.  Requesting renewal of sildenafil.  This has worked well for him.  No significant side effects.  ROS:  A comprehensive ROS was completed and negative except as noted per HPI   Allergies  Allergen Reactions   Bee Venom Anaphylaxis    Past Medical History:  Diagnosis Date   H/O chronic ear infection 07/04/2014    Past Surgical History:  Procedure Laterality Date   left knee surgery ligament tension     TYMPANOSTOMY TUBE PLACEMENT     as a child and as an adult    Social History   Socioeconomic History   Marital status: Married    Spouse name: Not on file   Number of children: Not on file   Years of education: Not on file   Highest education level: Not on file  Occupational History   Not on file  Tobacco Use   Smoking status: Never   Smokeless tobacco: Never  Substance and Sexual Activity   Alcohol use: Yes    Alcohol/week: 12.0 standard drinks of alcohol    Types: 12 Standard drinks or equivalent per week   Drug use: No   Sexual activity: Yes    Partners: Female  Other Topics Concern   Not on file  Social History Narrative   Not on file   Social Determinants of Health   Financial Resource Strain: Not on file  Food Insecurity: Not on file  Transportation Needs: Not on file  Physical Activity: Not on file  Stress: Not on file  Social Connections: Not on  file    Family History  Problem Relation Age of Onset   Prostate cancer Father    Hypertension Father    Diabetes Mother     Health Maintenance  Topic Date Due   COVID-19 Vaccine (3 - Moderna series) 04/07/2022 (Originally 02/19/2020)   INFLUENZA VACCINE  06/05/2022 (Originally 10/05/2021)   COLONOSCOPY (Pts 45-66yrs Insurance coverage will need to be confirmed)  07/22/2022 (Originally 04/19/2018)   Hepatitis C Screening  07/22/2022 (Originally 04/20/1991)   TETANUS/TDAP  07/21/2031   HIV Screening  Completed   HPV VACCINES  Aged Out     ----------------------------------------------------------------------------------------------------------------------------------------------------------------------------------------------------------------- Physical Exam BP 127/75 (BP Location: Left Arm, Patient Position: Sitting, Cuff Size: Normal)   Pulse 84   Ht 5' 6.93" (1.7 m)   Wt 173 lb (78.5 kg)   SpO2 94%   BMI 27.15 kg/m   Physical Exam Constitutional:      Appearance: Normal appearance.  HENT:     Head: Normocephalic and atraumatic.  Eyes:     General: No scleral icterus. Cardiovascular:     Rate and Rhythm: Normal rate and regular rhythm.     Heart sounds: Normal heart sounds.  Pulmonary:     Effort: Pulmonary effort is normal.     Breath sounds: Normal breath sounds.  Musculoskeletal:     Cervical back: Neck supple.  Neurological:     General: No focal deficit present.     Mental Status: He is alert.  Psychiatric:        Mood and Affect: Mood normal.        Behavior: Behavior normal.     ------------------------------------------------------------------------------------------------------------------------------------------------------------------------------------------------------------------- Assessment and Plan  Presence of tympanostomy tube in right tympanic membrane History recurrent otitis media.  Uses ofloxacin when needed for worsening symptoms.  I did  send a renewal of this in.  Erectile dysfunction Sildenafil renewed.  He is aware of precautions with this.  Post-viral cough syndrome Adding Tessalon Perles as needed.   Meds ordered this encounter  Medications   sildenafil (REVATIO) 20 MG tablet    Sig: TAKE 2-4 TABLETS BY MOUTH ONLY AS NEEDED.    Dispense:  30 tablet    Refill:  6   ofloxacin (FLOXIN) 0.3 % OTIC solution    Sig: Place 10 drops into both ears daily. For 7 days.    Dispense:  5 mL    Refill:  1   benzonatate (TESSALON) 200 MG capsule    Sig: Take 1 capsule (200 mg total) by mouth 2 (two) times daily as needed for cough.    Dispense:  20 capsule    Refill:  3    No follow-ups on file.    This visit occurred during the SARS-CoV-2 public health emergency.  Safety protocols were in place, including screening questions prior to the visit, additional usage of staff PPE, and extensive cleaning of exam room while observing appropriate contact time as indicated for disinfecting solutions.

## 2022-01-13 NOTE — Assessment & Plan Note (Signed)
Adding Tessalon Perles as needed.

## 2022-01-13 NOTE — Assessment & Plan Note (Signed)
History recurrent otitis media.  Uses ofloxacin when needed for worsening symptoms.  I did send a renewal of this in.

## 2022-01-25 ENCOUNTER — Other Ambulatory Visit: Payer: Self-pay | Admitting: Sports Medicine

## 2022-01-25 ENCOUNTER — Telehealth: Payer: Self-pay

## 2022-01-25 DIAGNOSIS — M25521 Pain in right elbow: Secondary | ICD-10-CM

## 2022-01-25 NOTE — Telephone Encounter (Signed)
Initiated Prior authorization ZJQ:[id number] Citrate 20MG  tablets Via: Covermymeds Case/Key:[id number]  Status: denied  as of 01/25/22 Reason:the use of medications for the treatment of sexual dysfunction or erectile dysfunction (ED) are excluded from coverage under the plan. For more information, please refer to the 01/27/22 and [employer] brochure ([place] 71-005 or [place] 71-017). Details regarding plan exclusions are listed in section 6. Notified Pt via: Mychart

## 2022-02-09 DIAGNOSIS — T63451A Toxic effect of venom of hornets, accidental (unintentional), initial encounter: Secondary | ICD-10-CM | POA: Diagnosis not present

## 2022-02-09 DIAGNOSIS — T63461A Toxic effect of venom of wasps, accidental (unintentional), initial encounter: Secondary | ICD-10-CM | POA: Diagnosis not present

## 2022-02-25 DIAGNOSIS — T63461D Toxic effect of venom of wasps, accidental (unintentional), subsequent encounter: Secondary | ICD-10-CM | POA: Diagnosis not present

## 2022-02-25 DIAGNOSIS — T63451D Toxic effect of venom of hornets, accidental (unintentional), subsequent encounter: Secondary | ICD-10-CM | POA: Diagnosis not present

## 2022-03-09 DIAGNOSIS — T63461A Toxic effect of venom of wasps, accidental (unintentional), initial encounter: Secondary | ICD-10-CM | POA: Diagnosis not present

## 2022-03-09 DIAGNOSIS — T63451A Toxic effect of venom of hornets, accidental (unintentional), initial encounter: Secondary | ICD-10-CM | POA: Diagnosis not present

## 2022-03-10 ENCOUNTER — Ambulatory Visit (INDEPENDENT_AMBULATORY_CARE_PROVIDER_SITE_OTHER): Payer: Federal, State, Local not specified - PPO

## 2022-03-10 ENCOUNTER — Ambulatory Visit: Payer: Federal, State, Local not specified - PPO | Admitting: Sports Medicine

## 2022-03-10 DIAGNOSIS — S43432A Superior glenoid labrum lesion of left shoulder, initial encounter: Secondary | ICD-10-CM | POA: Diagnosis not present

## 2022-03-10 MED ORDER — DICLOFENAC SODIUM 75 MG PO TBEC: 75.0000 mg | DELAYED_RELEASE_TABLET | Freq: Two times a day (BID) | ORAL | 3 refills | Status: AC

## 2022-03-10 NOTE — Progress Notes (Signed)
    Procedures performed today:    None.  Independent interpretation of notes and tests performed by another provider:   None.  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 Worse with overhead activities and benchpress. He has tried some home treatments with meloxicam, conditioning without sufficient improvement. On exam he has positive empty can sign, positive Neer sign, positive O'Brien's test, positive speeds test. This makes me suspicious for a SLAP tear, biceps tendinopathy and impingement syndrome. Due to persistence of discomfort and spite conservative treatment we will proceed with x-rays today, MR arthrography, and I will switch him from meloxicam to diclofenac. I would like him to start some home conditioning, this was given today. I will see him back for the arthrogram injection.  Chronic process with exacerbation and pharmacologic intervention  ____________________________________________ Gwen Her. Dianah Field, M.D., ABFM., CAQSM., AME. Primary Care and Sports Medicine [facility]  Adjunct Professor of [employer] [facility] of Medicine  Risk manager

## 2022-03-10 NOTE — Assessment & Plan Note (Signed)
This is a very pleasant 47 year old male, has had several months of pain left shoulder anterior aspect with mechanical symptoms, popping and catching. Worse with overhead activities and benchpress. He has tried some home treatments with meloxicam, conditioning without sufficient improvement. On exam he has positive empty can sign, positive Neer sign, positive O'Brien's test, positive speeds test. This makes me suspicious for a SLAP tear, biceps tendinopathy and impingement syndrome. Due to persistence of discomfort and spite conservative treatment we will proceed with x-rays today, MR arthrography, and I will switch him from meloxicam to diclofenac. I would like him to start some home conditioning, this was given today. I will see him back for the arthrogram injection.

## 2022-03-17 ENCOUNTER — Ambulatory Visit: Payer: Federal, State, Local not specified - PPO | Admitting: Sports Medicine

## 2022-03-18 ENCOUNTER — Encounter: Payer: Self-pay | Admitting: Emergency Medicine

## 2022-03-18 ENCOUNTER — Ambulatory Visit
Admission: EM | Admit: 2022-03-18 | Discharge: 2022-03-18 | Disposition: A | Discharge status: Home / Self Care | Payer: Federal, State, Local not specified - PPO | Attending: Family Medicine | Admitting: Family Medicine

## 2022-03-18 DIAGNOSIS — H109 Unspecified conjunctivitis: Secondary | ICD-10-CM

## 2022-03-18 DIAGNOSIS — J3489 Other specified disorders of nose and nasal sinuses: Secondary | ICD-10-CM

## 2022-03-18 DIAGNOSIS — J0100 Acute maxillary sinusitis, unspecified: Secondary | ICD-10-CM | POA: Diagnosis not present

## 2022-03-18 DIAGNOSIS — H6693 Otitis media, unspecified, bilateral: Secondary | ICD-10-CM

## 2022-03-18 MED ORDER — POLYMYXIN B-TRIMETHOPRIM 10000-0.1 UNIT/ML-% OP SOLN: 1.0000 [drp] | Freq: Four times a day (QID) | OPHTHALMIC | 0 refills | Status: AC

## 2022-03-18 MED ORDER — PREDNISONE 20 MG PO TABS: ORAL_TABLET | ORAL | 0 refills | Status: DC

## 2022-03-18 MED ORDER — AMOXICILLIN-POT CLAVULANATE 875-125 MG PO TABS: 1.0000 | ORAL_TABLET | Freq: Two times a day (BID) | ORAL | 0 refills | Status: AC

## 2022-03-18 NOTE — ED Provider Notes (Signed)
[name]    CSN: [id number] Arrival date & time: 03/18/22  0801      History   Chief Complaint Chief Complaint  Patient presents with   Facial Pain    HPI Matthew Marks is a 47 y.o. male.   HPI   47 year old male presents with sinus nasal congestion for the past 2 weeks, going back to [place] and has not improved.  PMH significant for chronic alcohol use, elevated glucose and DDD of lumbar region.   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 03/10/2022   Erectile dysfunction 01/13/2022   Thrombosed external hemorrhoid 01/02/2020   Post-viral cough syndrome 10/10/2019   Insect sting 08/30/2019   Biceps tendinitis, right, distal 06/25/2019   Well adult exam 05/01/2019   Elevated fasting glucose 05/01/2017   Tinea pedis of both feet 06/07/2016   MDD (major depressive disorder), recurrent episode, moderate (HCC) 05/25/2016   Chronic alcohol use 05/25/2016   GAD (generalized anxiety disorder) 04/19/2016   Poor concentration 10/13/2015   Globus sensation 09/04/2014   DDD (degenerative disc disease), lumbar 07/10/2014   H/O chronic ear infection 07/04/2014   Family history of prostate cancer 07/04/2014   Bilateral plantar fasciitis 07/04/2014   Presence of tympanostomy tube in right tympanic membrane 07/04/2014    Past Surgical History:  Procedure Laterality Date   BACK SURGERY     left knee surgery ligament tension     TYMPANOSTOMY TUBE PLACEMENT     as a child and as an adult       Home Medications    Prior to Admission medications   Medication Sig Start Date End Date Taking? Authorizing Provider  amoxicillin-clavulanate (AUGMENTIN) 875-125 MG tablet Take 1 tablet by mouth 2 (two) times daily for 10 days. 03/18/22 03/28/22 Yes Eliezer Lofts, FNP  predniSONE (DELTASONE) 20 MG tablet Take 3 tabs PO daily x 5 days. 03/18/22  Yes Eliezer Lofts, FNP   trimethoprim-polymyxin b (POLYTRIM) ophthalmic solution Place 1 drop into both eyes 4 (four) times daily for 5 days. 03/18/22 03/23/22 Yes Eliezer Lofts, FNP  diclofenac (VOLTAREN) 75 MG EC tablet Take 1 tablet (75 mg total) by mouth 2 (two) times daily. 03/10/22 03/10/23  Silverio Decamp, MD  hydrocortisone (ANUSOL-HC) 25 MG suppository Place 1 suppository (25 mg total) rectally 2 (two) times daily as needed for hemorrhoids or anal itching. 07/20/21   Luetta Nutting, DO  lidocaine (XYLOCAINE) 5 % ointment Apply 1 application. topically 4 (four) times daily as needed. 07/20/21   Luetta Nutting, DO  sildenafil (REVATIO) 20 MG tablet TAKE 2-4 TABLETS BY MOUTH ONLY AS NEEDED. 01/13/22   Luetta Nutting, DO    Family History Family History  Problem Relation Age of Onset   Prostate cancer Father    Hypertension Father    Diabetes Mother     Social History Social History   Tobacco Use   Smoking status: Never   Smokeless tobacco: Never  Vaping Use   Vaping Use: Never used  Substance Use Topics   Alcohol use: Yes    Alcohol/week: 12.0 standard drinks of alcohol    Types: 12 Standard drinks or equivalent per week   Drug use: No     Allergies   Bee venom   Review of Systems Review of Systems   Physical Exam Triage Vital Signs ED Triage Vitals  Enc Vitals Group     BP 03/18/22 0822 133/88  Pulse Rate 03/18/22 0822 88     Resp 03/18/22 0822 16     Temp 03/18/22 0822 98.3 F (36.8 C)     Temp Source 03/18/22 0822 Oral     SpO2 03/18/22 0822 97 %     Weight --      Height --      Head Circumference --      Peak Flow --      Pain Score 03/18/22 0823 4     Pain Loc --      Pain Edu? --      Excl. in [place]? --    No data found.  Updated Vital Signs BP 133/88 (BP Location: Right Arm)   Pulse 88   Temp 98.3 F (36.8 C) (Oral)   Resp 16   SpO2 97%   Visual Acuity Right Eye Distance:   Left Eye Distance:   Bilateral Distance:    Right Eye Near:   Left Eye Near:     Bilateral Near:     Physical Exam Vitals and nursing note reviewed.  Constitutional:      Appearance: Normal appearance. He is normal weight. He is ill-appearing.  HENT:     Head: Normocephalic and atraumatic.     Right Ear: Ear canal and external ear normal.     Left Ear: Ear canal and external ear normal.     Ears:     Comments: Bilateral TMs: Erythematous, bulging with scant mucopurulent effusions; moderate eustachian tube dysfunction noted bilaterally    Mouth/Throat:     Mouth: Mucous membranes are moist.     Pharynx: Oropharynx is clear.     Comments: Moderate amount of clear drainage of posterior oropharynx noted Eyes:     Extraocular Movements: Extraocular movements intact.     Conjunctiva/sclera: Conjunctivae normal.     Pupils: Pupils are equal, round, and reactive to light.     Comments: Sclera with +3 injection bilaterally with mucoid purulent crusting noted of bilateral inner canthus  Cardiovascular:     Rate and Rhythm: Normal rate and regular rhythm.     Pulses: Normal pulses.     Heart sounds: Normal heart sounds.  Pulmonary:     Effort: Pulmonary effort is normal.     Breath sounds: Normal breath sounds. No wheezing, rhonchi or rales.  Musculoskeletal:        General: Normal range of motion.  Skin:    General: Skin is warm and dry.  Neurological:     General: No focal deficit present.     Mental Status: He is alert and oriented to person, place, and time.      UC Treatments / Results  Labs (all labs ordered are listed, but only abnormal results are displayed) Labs Reviewed - No data to display  EKG   Radiology No results found.  Procedures Procedures (including critical care time)  Medications Ordered in UC Medications - No data to display  Initial Impression / Assessment and Plan / UC Course  I have reviewed the triage vital signs and the nursing notes.  Pertinent labs & imaging results that were available during my care of the patient  were reviewed by me and considered in my medical decision making (see chart for details).     MDM: 1.  Acute maxillary sinusitis, recurrence not specified-are Rx'd Augmentin; 2.  Sinus pressure-Rx prednisone; 3.  Acute bilateral otitis media-same as 1 Rx'd Augmentin; 4.  Conjunctivitis of both eyes, unspecified conjunctivitis type-Rx'd Polytrim Final  Clinical Impressions(s) / UC Diagnoses   Final diagnoses:  Acute maxillary sinusitis, recurrence not specified  Sinus pressure  Acute bilateral otitis media  Conjunctivitis of both eyes, unspecified conjunctivitis type     Discharge Instructions      To take medications as directed with food to completion.  Advised patient to take prednisone with first dose of Augmentin for the next 5 of 10 days.  Advised patient to instill eyedrops as directed.  Encouraged patient to increase daily water intake while taking these medications.  Advised if symptoms worsen and/or unresolved please follow-up with PCP or here for further evaluation.     ED Prescriptions     Medication Sig Dispense Auth. Provider   amoxicillin-clavulanate (AUGMENTIN) 875-125 MG tablet Take 1 tablet by mouth 2 (two) times daily for 10 days. 20 tablet Trevor Iha, FNP   predniSONE (DELTASONE) 20 MG tablet Take 3 tabs PO daily x 5 days. 15 tablet Trevor Iha, FNP   trimethoprim-polymyxin b (POLYTRIM) ophthalmic solution Place 1 drop into both eyes 4 (four) times daily for 5 days. 10 mL Trevor Iha, FNP      PDMP not reviewed this encounter.   Trevor Iha, FNP 03/18/22 [phone]

## 2022-03-18 NOTE — ED Triage Notes (Signed)
Pt states he had flu/cold symptoms over christmas but states the facial pain, ear pain and sinus congestion has not improved over the last 2 weeks. Taking OTC cold meds. Afebrile.

## 2022-03-18 NOTE — Discharge Instructions (Addendum)
To take medications as directed with food to completion.  Advised patient to take prednisone with first dose of Augmentin for the next 5 of 10 days.  Advised patient to instill eyedrops as directed.  Encouraged patient to increase daily water intake while taking these medications.  Advised if symptoms worsen and/or unresolved please follow-up with PCP or here for further evaluation.

## 2022-03-19 ENCOUNTER — Telehealth: Payer: Self-pay

## 2022-03-19 NOTE — Telephone Encounter (Signed)
TC to f/u with pt after yesterday's visit to [facility]. Pt has no problems or questions to address at this time, stating he is beginning to feel better.

## 2022-03-21 ENCOUNTER — Ambulatory Visit (INDEPENDENT_AMBULATORY_CARE_PROVIDER_SITE_OTHER): Payer: Federal, State, Local not specified - PPO

## 2022-03-21 ENCOUNTER — Ambulatory Visit: Payer: Federal, State, Local not specified - PPO | Admitting: Sports Medicine

## 2022-03-21 DIAGNOSIS — S43432A Superior glenoid labrum lesion of left shoulder, initial encounter: Secondary | ICD-10-CM

## 2022-03-21 DIAGNOSIS — R600 Localized edema: Secondary | ICD-10-CM | POA: Diagnosis not present

## 2022-03-21 DIAGNOSIS — M19012 Primary osteoarthritis, left shoulder: Secondary | ICD-10-CM | POA: Diagnosis not present

## 2022-03-21 MED ORDER — TRIAMCINOLONE ACETONIDE 40 MG/ML IJ SUSP
40.0000 mg | Freq: Once | INTRAMUSCULAR | Status: AC
  Administered 2022-03-21: 40 mg via INTRAMUSCULAR

## 2022-03-21 NOTE — Progress Notes (Addendum)
    Procedures performed today:    Procedure: Real-time Ultrasound Guided gadolinium contrast injection of left glenohumeral joint Device: Samsung HS60  Verbal informed consent obtained.  Time-out conducted.  Noted no overlying erythema, induration, or other signs of local infection.  Skin prepped in a sterile fashion.  Local anesthesia: Topical Ethyl chloride.  With sterile technique and under real time ultrasound guidance: Noted normal-appearing joint, 22 spinal needle advanced into the glenohumeral joint from a posterior approach, I injected 1 cc kenalog 40, 2 cc lidocaine, 2 cc bupivacaine, syringe switched and 0.1 cc gadolinium injected, syringe again switched and 10 cc sterile saline used to fully distend the joint. Joint visualized and capsule seen distending confirming intra-articular placement of contrast material and medication. Completed without difficulty  Advised to call if fevers/chills, erythema, induration, drainage, or persistent bleeding.  Images permanently stored in PACS Impression: Technically successful ultrasound guided gadolinium contrast injection for MR arthrography.  Please see separate MR arthrogram report.  Independent interpretation of notes and tests performed by another provider:   None.  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 Worse with overhead activities and benchpress. He has tried some home treatments with meloxicam, conditioning without sufficient improvement. On exam he has positive empty can sign, positive Neer sign, positive O'Brien's test, positive speeds test. This makes me suspicious for a SLAP tear, biceps tendinopathy and impingement syndrome. Due to persistence of discomfort and spite  conservative treatment we will proceed with x-rays today, MR arthrography, and I will switch him from meloxicam to diclofenac. I would like him to start some home conditioning, this was given today. I will see him back for the arthrogram injection.  Update: Today we performed the injection for MR arthrography.    ____________________________________________ Gwen Her. Dianah Field, M.D., ABFM., CAQSM., AME. Primary Care and Sports Medicine [facility]  Adjunct Professor of [employer] [facility] of Medicine  Risk manager

## 2022-03-21 NOTE — Assessment & Plan Note (Signed)
This is a very pleasant 47 year old male, has had several months of pain left shoulder anterior aspect with mechanical symptoms, popping and catching. Worse with overhead activities and benchpress. He has tried some home treatments with meloxicam, conditioning without sufficient improvement. On exam he has positive empty can sign, positive Neer sign, positive O'Brien's test, positive speeds test. This makes me suspicious for a SLAP tear, biceps tendinopathy and impingement syndrome. Due to persistence of discomfort and spite conservative treatment we will proceed with x-rays today, MR arthrography, and I will switch him from meloxicam to diclofenac. I would like him to start some home conditioning, this was given today. I will see him back for the arthrogram injection.  Update: Today we performed the injection for MR arthrography.

## 2022-03-25 ENCOUNTER — Ambulatory Visit: Payer: Federal, State, Local not specified - PPO | Admitting: Family Medicine

## 2022-03-25 ENCOUNTER — Ambulatory Visit: Payer: Federal, State, Local not specified - PPO | Admitting: Sports Medicine

## 2022-03-25 ENCOUNTER — Encounter: Payer: Self-pay | Admitting: Family Medicine

## 2022-03-25 VITALS — BP 116/70 | HR 69 | Temp 97.9°F | Ht 66.93 in | Wt 170.2 lb

## 2022-03-25 DIAGNOSIS — S43432A Superior glenoid labrum lesion of left shoulder, initial encounter: Secondary | ICD-10-CM | POA: Diagnosis not present

## 2022-03-25 DIAGNOSIS — H6992 Unspecified Eustachian tube disorder, left ear: Secondary | ICD-10-CM | POA: Insufficient documentation

## 2022-03-25 MED ORDER — CETIRIZINE HCL 10 MG PO TABS: 10.0000 mg | ORAL_TABLET | Freq: Every day | ORAL | 0 refills | Status: DC

## 2022-03-25 MED ORDER — FLUTICASONE PROPIONATE 50 MCG/ACT NA SUSP: 2.0000 | Freq: Every day | NASAL | 0 refills | Status: DC

## 2022-03-25 NOTE — Assessment & Plan Note (Addendum)
Currently on Augmentin for sinusitis and left otitis media.  Prescribed on January 12 for 10 days.  Has chronic ear issues, has an appointment with ENT soon.  Concern today for continued head congestion.  Recommend sinus rinses, cetirizine 10 mg daily, and Flonase nasal spray.  He will complete the course of Augmentin.  Has completed prednisone.  Overall his symptoms have improved since starting treatment.  Left TM injected, on appropriate treatment.  If cetirizine does not improve his symptoms, he will try over-the-counter Sudafed.  He will monitor his blood pressure if taking decongestant.

## 2022-03-25 NOTE — Patient Instructions (Signed)
Continue your Augmentin as prescribed.  Sinus rinses and OTC decongestant will help the head congestion.  Keep an eye on blood pressure while on decongestant.

## 2022-03-25 NOTE — Progress Notes (Signed)
    Procedures performed today:    None.  Independent interpretation of notes and tests performed by another provider:   None.  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 Worse with overhead activity and bench press. He tried some home treatments with meloxicam, home physical therapy all without sufficient improvement. On exam he did have a positive decant sign, positive Neer sign, positive O'Brien's test, positive speeds test. We were suspicious for SLAP tear as well as cuff disease. At that point he had failed conservative treatment so we proceeded with MR arthrography that did show some cuff tendinosis, small amount of cuff tearing, labral undercutting suspicious for SLAP tear and some glenohumeral degenerative changes. I did place steroid and the arthrogram injection and he is really not improved so I would like consultation with orthopedic surgery.    ____________________________________________ Gwen Her. Dianah Field, M.D., ABFM., CAQSM., AME. Primary Care and Sports Medicine [facility] [facility]  Adjunct Professor of [place] [employer] [facility] of Medicine  Risk manager

## 2022-03-25 NOTE — Assessment & Plan Note (Signed)
Matthew Marks returns, he is a very pleasant 47 year old male, several months of pain left shoulder, anterior aspect with mechanical symptoms, popping, catching. Worse with overhead activity and bench press. He tried some home treatments with meloxicam, home physical therapy all without sufficient improvement. On exam he did have a positive decant sign, positive Neer sign, positive O'Brien's test, positive speeds test. We were suspicious for SLAP tear as well as cuff disease. At that point he had failed conservative treatment so we proceeded with MR arthrography that did show some cuff tendinosis, small amount of cuff tearing, labral undercutting suspicious for SLAP tear and some glenohumeral degenerative changes. I did place steroid and the arthrogram injection and he is really not improved so I would like consultation with orthopedic surgery.

## 2022-03-25 NOTE — Progress Notes (Signed)
Acute Office Visit  Subjective:     Patient ID: Matthew Marks, male    DOB: 1976-01-18, 47 y.o.   MRN: [id number]  Chief Complaint  Patient presents with   Follow-up    ED Sinus infection, Double ear infection, Double Pink eye   Ear Fullness    Mild pain   Hoarse    Pt complains of raspiness.     HPI Presents today for an acute visit with complaint of sinus drainage, voice is raspy, left ear discomfort.  Symptoms have been present  3 weeks ago, have improved slightly since treatment.  Associated symptoms include: occasional cough Pertinent negatives: no fever or chills Pain severity: 0/10 Treatments tried include : Augmentin, prednisone,  started sudafed last night.  Treatment effective : helped some, not resolved.  Sick contacts : n/a Chart review: 03/18/22: started on Augmentin for 10 days. Prednisone for 5 days (completed therapy).   Does not feel sick, head congestion is main concern.   Has ENT appointment for chronic ear issues soon.   Review of Systems  Constitutional:  Negative for chills and fever.  HENT:  Negative for congestion and sinus pain.   Respiratory:  Positive for cough (occassional). Negative for wheezing.   Cardiovascular:  Negative for chest pain.  Gastrointestinal:  Negative for nausea and vomiting.        Objective:    BP 116/70 (BP Location: Right Arm, Patient Position: Sitting, Cuff Size: Normal)   Pulse 69   Temp 97.9 F (36.6 C) (Oral)   Ht 5' 6.93" (1.7 m)   Wt 170 lb 3.2 oz (77.2 kg)   SpO2 98%   BMI 26.71 kg/m    Physical Exam Vitals and nursing note reviewed.  Constitutional:      General: He is not in acute distress.    Appearance: Normal appearance. He is not ill-appearing.  HENT:     Left Ear: Tympanic membrane is injected.     Nose:     Right Sinus: No maxillary sinus tenderness or frontal sinus tenderness.     Left Sinus: No maxillary sinus tenderness or frontal sinus tenderness.     Mouth/Throat:     Pharynx: Uvula  midline. No oropharyngeal exudate or posterior oropharyngeal erythema.  Cardiovascular:     Rate and Rhythm: Normal rate and regular rhythm.     Heart sounds: Normal heart sounds.  Pulmonary:     Effort: Pulmonary effort is normal.     Breath sounds: Normal breath sounds.  Skin:    General: Skin is warm and dry.  Neurological:     General: No focal deficit present.     Mental Status: He is alert. Mental status is at baseline.  Psychiatric:        Mood and Affect: Mood normal.        Behavior: Behavior normal.     No results found for any visits on 03/25/22.      Assessment & Plan:   Problem List Items Addressed This Visit     Dysfunction of left eustachian tube - Primary    Currently on Augmentin for sinusitis and left otitis media.  Prescribed on January 12 for 10 days.  Has chronic ear issues, has an appointment with ENT soon.  Concern today for continued head congestion.  Recommend sinus rinses, cetirizine 10 mg daily, and Flonase nasal spray.  He will complete the course of Augmentin.  Has completed prednisone.  Overall his symptoms have improved since starting treatment.  Left TM injected, on appropriate treatment.  If cetirizine does not improve his symptoms, he will try over-the-counter Sudafed.  He will monitor his blood pressure if taking decongestant.      Relevant Medications   cetirizine (ZYRTEC) 10 MG tablet   fluticasone (FLONASE) 50 MCG/ACT nasal spray    Meds ordered this encounter  Medications   cetirizine (ZYRTEC) 10 MG tablet    Sig: Take 1 tablet (10 mg total) by mouth daily.    Dispense:  30 tablet    Refill:  0    Order Specific Question:   Supervising Provider    Answer:   Leeanne Rio [[id number]]   fluticasone (FLONASE) 50 MCG/ACT nasal spray    Sig: Place 2 sprays into both nostrils daily.    Dispense:  16 g    Refill:  0    Order Specific Question:   Supervising Provider    Answer:   Leeanne Rio [[id number]]  Agrees with plan of care  discussed.  Questions answered.   No follow-ups on file.  Chalmers Guest, FNP

## 2022-04-06 DIAGNOSIS — T63461A Toxic effect of venom of wasps, accidental (unintentional), initial encounter: Secondary | ICD-10-CM | POA: Diagnosis not present

## 2022-04-06 DIAGNOSIS — T63451A Toxic effect of venom of hornets, accidental (unintentional), initial encounter: Secondary | ICD-10-CM | POA: Diagnosis not present

## 2022-04-07 DIAGNOSIS — S43431A Superior glenoid labrum lesion of right shoulder, initial encounter: Secondary | ICD-10-CM | POA: Diagnosis not present

## 2022-04-07 DIAGNOSIS — M7521 Bicipital tendinitis, right shoulder: Secondary | ICD-10-CM | POA: Diagnosis not present

## 2022-04-07 DIAGNOSIS — M25512 Pain in left shoulder: Secondary | ICD-10-CM | POA: Diagnosis not present

## 2022-05-02 DIAGNOSIS — H6993 Unspecified Eustachian tube disorder, bilateral: Secondary | ICD-10-CM | POA: Diagnosis not present

## 2022-05-02 DIAGNOSIS — H6121 Impacted cerumen, right ear: Secondary | ICD-10-CM | POA: Diagnosis not present

## 2022-05-02 DIAGNOSIS — Z9622 Myringotomy tube(s) status: Secondary | ICD-10-CM | POA: Diagnosis not present

## 2022-05-02 DIAGNOSIS — H900 Conductive hearing loss, bilateral: Secondary | ICD-10-CM | POA: Diagnosis not present

## 2022-05-04 DIAGNOSIS — T63451A Toxic effect of venom of hornets, accidental (unintentional), initial encounter: Secondary | ICD-10-CM | POA: Diagnosis not present

## 2022-05-04 DIAGNOSIS — T63461A Toxic effect of venom of wasps, accidental (unintentional), initial encounter: Secondary | ICD-10-CM | POA: Diagnosis not present

## 2022-05-07 ENCOUNTER — Other Ambulatory Visit: Payer: Self-pay | Admitting: Family Medicine

## 2022-05-07 DIAGNOSIS — N529 Male erectile dysfunction, unspecified: Secondary | ICD-10-CM

## 2022-05-16 DIAGNOSIS — M7542 Impingement syndrome of left shoulder: Secondary | ICD-10-CM | POA: Diagnosis not present

## 2022-05-16 DIAGNOSIS — M94212 Chondromalacia, left shoulder: Secondary | ICD-10-CM | POA: Diagnosis not present

## 2022-05-16 DIAGNOSIS — M659 Synovitis and tenosynovitis, unspecified: Secondary | ICD-10-CM | POA: Diagnosis not present

## 2022-05-16 DIAGNOSIS — M65812 Other synovitis and tenosynovitis, left shoulder: Secondary | ICD-10-CM | POA: Diagnosis not present

## 2022-05-16 DIAGNOSIS — M75102 Unspecified rotator cuff tear or rupture of left shoulder, not specified as traumatic: Secondary | ICD-10-CM | POA: Diagnosis not present

## 2022-05-16 DIAGNOSIS — S43432A Superior glenoid labrum lesion of left shoulder, initial encounter: Secondary | ICD-10-CM | POA: Diagnosis not present

## 2022-05-16 DIAGNOSIS — G8918 Other acute postprocedural pain: Secondary | ICD-10-CM | POA: Diagnosis not present

## 2022-05-16 DIAGNOSIS — M25812 Other specified joint disorders, left shoulder: Secondary | ICD-10-CM | POA: Diagnosis not present

## 2022-05-16 DIAGNOSIS — M75112 Incomplete rotator cuff tear or rupture of left shoulder, not specified as traumatic: Secondary | ICD-10-CM | POA: Diagnosis not present

## 2022-05-16 DIAGNOSIS — M7522 Bicipital tendinitis, left shoulder: Secondary | ICD-10-CM | POA: Diagnosis not present

## 2022-05-16 DIAGNOSIS — M24112 Other articular cartilage disorders, left shoulder: Secondary | ICD-10-CM | POA: Diagnosis not present

## 2022-05-20 DIAGNOSIS — M25512 Pain in left shoulder: Secondary | ICD-10-CM | POA: Diagnosis not present

## 2022-05-20 DIAGNOSIS — Z7409 Other reduced mobility: Secondary | ICD-10-CM | POA: Diagnosis not present

## 2022-05-20 DIAGNOSIS — M25612 Stiffness of left shoulder, not elsewhere classified: Secondary | ICD-10-CM | POA: Diagnosis not present

## 2022-05-20 DIAGNOSIS — S43432D Superior glenoid labrum lesion of left shoulder, subsequent encounter: Secondary | ICD-10-CM | POA: Diagnosis not present

## 2022-05-23 DIAGNOSIS — M25512 Pain in left shoulder: Secondary | ICD-10-CM | POA: Diagnosis not present

## 2022-05-23 DIAGNOSIS — Z7409 Other reduced mobility: Secondary | ICD-10-CM | POA: Diagnosis not present

## 2022-05-23 DIAGNOSIS — M6281 Muscle weakness (generalized): Secondary | ICD-10-CM | POA: Diagnosis not present

## 2022-05-23 DIAGNOSIS — S43432D Superior glenoid labrum lesion of left shoulder, subsequent encounter: Secondary | ICD-10-CM | POA: Diagnosis not present

## 2022-05-27 DIAGNOSIS — M6281 Muscle weakness (generalized): Secondary | ICD-10-CM | POA: Diagnosis not present

## 2022-05-27 DIAGNOSIS — M25512 Pain in left shoulder: Secondary | ICD-10-CM | POA: Diagnosis not present

## 2022-05-27 DIAGNOSIS — Z7409 Other reduced mobility: Secondary | ICD-10-CM | POA: Diagnosis not present

## 2022-05-27 DIAGNOSIS — S43432D Superior glenoid labrum lesion of left shoulder, subsequent encounter: Secondary | ICD-10-CM | POA: Diagnosis not present

## 2022-05-30 DIAGNOSIS — S43432D Superior glenoid labrum lesion of left shoulder, subsequent encounter: Secondary | ICD-10-CM | POA: Diagnosis not present

## 2022-05-30 DIAGNOSIS — M6281 Muscle weakness (generalized): Secondary | ICD-10-CM | POA: Diagnosis not present

## 2022-05-30 DIAGNOSIS — Z7409 Other reduced mobility: Secondary | ICD-10-CM | POA: Diagnosis not present

## 2022-05-30 DIAGNOSIS — M25512 Pain in left shoulder: Secondary | ICD-10-CM | POA: Diagnosis not present

## 2022-06-01 DIAGNOSIS — T63451A Toxic effect of venom of hornets, accidental (unintentional), initial encounter: Secondary | ICD-10-CM | POA: Diagnosis not present

## 2022-06-01 DIAGNOSIS — T63461A Toxic effect of venom of wasps, accidental (unintentional), initial encounter: Secondary | ICD-10-CM | POA: Diagnosis not present

## 2022-06-02 DIAGNOSIS — Z9889 Other specified postprocedural states: Secondary | ICD-10-CM | POA: Diagnosis not present

## 2022-06-08 DIAGNOSIS — M6281 Muscle weakness (generalized): Secondary | ICD-10-CM | POA: Diagnosis not present

## 2022-06-08 DIAGNOSIS — Z7409 Other reduced mobility: Secondary | ICD-10-CM | POA: Diagnosis not present

## 2022-06-08 DIAGNOSIS — S43432D Superior glenoid labrum lesion of left shoulder, subsequent encounter: Secondary | ICD-10-CM | POA: Diagnosis not present

## 2022-06-08 DIAGNOSIS — M25512 Pain in left shoulder: Secondary | ICD-10-CM | POA: Diagnosis not present

## 2022-06-10 DIAGNOSIS — M6281 Muscle weakness (generalized): Secondary | ICD-10-CM | POA: Diagnosis not present

## 2022-06-10 DIAGNOSIS — M25512 Pain in left shoulder: Secondary | ICD-10-CM | POA: Diagnosis not present

## 2022-06-10 DIAGNOSIS — S43432D Superior glenoid labrum lesion of left shoulder, subsequent encounter: Secondary | ICD-10-CM | POA: Diagnosis not present

## 2022-06-10 DIAGNOSIS — Z7409 Other reduced mobility: Secondary | ICD-10-CM | POA: Diagnosis not present

## 2022-06-15 DIAGNOSIS — Z7409 Other reduced mobility: Secondary | ICD-10-CM | POA: Diagnosis not present

## 2022-06-15 DIAGNOSIS — M25512 Pain in left shoulder: Secondary | ICD-10-CM | POA: Diagnosis not present

## 2022-06-15 DIAGNOSIS — S43432D Superior glenoid labrum lesion of left shoulder, subsequent encounter: Secondary | ICD-10-CM | POA: Diagnosis not present

## 2022-06-15 DIAGNOSIS — M6281 Muscle weakness (generalized): Secondary | ICD-10-CM | POA: Diagnosis not present

## 2022-06-17 DIAGNOSIS — M6281 Muscle weakness (generalized): Secondary | ICD-10-CM | POA: Diagnosis not present

## 2022-06-17 DIAGNOSIS — S43432D Superior glenoid labrum lesion of left shoulder, subsequent encounter: Secondary | ICD-10-CM | POA: Diagnosis not present

## 2022-06-17 DIAGNOSIS — Z7409 Other reduced mobility: Secondary | ICD-10-CM | POA: Diagnosis not present

## 2022-06-17 DIAGNOSIS — M25512 Pain in left shoulder: Secondary | ICD-10-CM | POA: Diagnosis not present

## 2022-06-22 DIAGNOSIS — M25512 Pain in left shoulder: Secondary | ICD-10-CM | POA: Diagnosis not present

## 2022-06-22 DIAGNOSIS — M6281 Muscle weakness (generalized): Secondary | ICD-10-CM | POA: Diagnosis not present

## 2022-06-22 DIAGNOSIS — Z7409 Other reduced mobility: Secondary | ICD-10-CM | POA: Diagnosis not present

## 2022-06-22 DIAGNOSIS — S43432D Superior glenoid labrum lesion of left shoulder, subsequent encounter: Secondary | ICD-10-CM | POA: Diagnosis not present

## 2022-06-24 DIAGNOSIS — S43432D Superior glenoid labrum lesion of left shoulder, subsequent encounter: Secondary | ICD-10-CM | POA: Diagnosis not present

## 2022-06-24 DIAGNOSIS — M6281 Muscle weakness (generalized): Secondary | ICD-10-CM | POA: Diagnosis not present

## 2022-06-24 DIAGNOSIS — Z7409 Other reduced mobility: Secondary | ICD-10-CM | POA: Diagnosis not present

## 2022-06-24 DIAGNOSIS — M25512 Pain in left shoulder: Secondary | ICD-10-CM | POA: Diagnosis not present

## 2022-06-29 DIAGNOSIS — M6281 Muscle weakness (generalized): Secondary | ICD-10-CM | POA: Diagnosis not present

## 2022-06-29 DIAGNOSIS — M25512 Pain in left shoulder: Secondary | ICD-10-CM | POA: Diagnosis not present

## 2022-06-29 DIAGNOSIS — S43432D Superior glenoid labrum lesion of left shoulder, subsequent encounter: Secondary | ICD-10-CM | POA: Diagnosis not present

## 2022-06-29 DIAGNOSIS — T63461A Toxic effect of venom of wasps, accidental (unintentional), initial encounter: Secondary | ICD-10-CM | POA: Diagnosis not present

## 2022-06-29 DIAGNOSIS — Z7409 Other reduced mobility: Secondary | ICD-10-CM | POA: Diagnosis not present

## 2022-06-29 DIAGNOSIS — T63451A Toxic effect of venom of hornets, accidental (unintentional), initial encounter: Secondary | ICD-10-CM | POA: Diagnosis not present

## 2022-06-30 DIAGNOSIS — Z9889 Other specified postprocedural states: Secondary | ICD-10-CM | POA: Diagnosis not present

## 2022-07-01 DIAGNOSIS — M25612 Stiffness of left shoulder, not elsewhere classified: Secondary | ICD-10-CM | POA: Diagnosis not present

## 2022-07-01 DIAGNOSIS — M6281 Muscle weakness (generalized): Secondary | ICD-10-CM | POA: Diagnosis not present

## 2022-07-01 DIAGNOSIS — S43432D Superior glenoid labrum lesion of left shoulder, subsequent encounter: Secondary | ICD-10-CM | POA: Diagnosis not present

## 2022-07-01 DIAGNOSIS — M25512 Pain in left shoulder: Secondary | ICD-10-CM | POA: Diagnosis not present

## 2022-07-06 DIAGNOSIS — M25512 Pain in left shoulder: Secondary | ICD-10-CM | POA: Diagnosis not present

## 2022-07-06 DIAGNOSIS — S43432D Superior glenoid labrum lesion of left shoulder, subsequent encounter: Secondary | ICD-10-CM | POA: Diagnosis not present

## 2022-07-06 DIAGNOSIS — M6281 Muscle weakness (generalized): Secondary | ICD-10-CM | POA: Diagnosis not present

## 2022-07-06 DIAGNOSIS — Z7409 Other reduced mobility: Secondary | ICD-10-CM | POA: Diagnosis not present

## 2022-07-08 DIAGNOSIS — S43432D Superior glenoid labrum lesion of left shoulder, subsequent encounter: Secondary | ICD-10-CM | POA: Diagnosis not present

## 2022-07-08 DIAGNOSIS — Z7409 Other reduced mobility: Secondary | ICD-10-CM | POA: Diagnosis not present

## 2022-07-08 DIAGNOSIS — M25512 Pain in left shoulder: Secondary | ICD-10-CM | POA: Diagnosis not present

## 2022-07-08 DIAGNOSIS — M6281 Muscle weakness (generalized): Secondary | ICD-10-CM | POA: Diagnosis not present

## 2022-07-13 DIAGNOSIS — M25512 Pain in left shoulder: Secondary | ICD-10-CM | POA: Diagnosis not present

## 2022-07-13 DIAGNOSIS — M6281 Muscle weakness (generalized): Secondary | ICD-10-CM | POA: Diagnosis not present

## 2022-07-13 DIAGNOSIS — Z7409 Other reduced mobility: Secondary | ICD-10-CM | POA: Diagnosis not present

## 2022-07-13 DIAGNOSIS — S43432D Superior glenoid labrum lesion of left shoulder, subsequent encounter: Secondary | ICD-10-CM | POA: Diagnosis not present

## 2022-07-15 DIAGNOSIS — M6281 Muscle weakness (generalized): Secondary | ICD-10-CM | POA: Diagnosis not present

## 2022-07-15 DIAGNOSIS — Z7409 Other reduced mobility: Secondary | ICD-10-CM | POA: Diagnosis not present

## 2022-07-15 DIAGNOSIS — S43432D Superior glenoid labrum lesion of left shoulder, subsequent encounter: Secondary | ICD-10-CM | POA: Diagnosis not present

## 2022-07-15 DIAGNOSIS — M25512 Pain in left shoulder: Secondary | ICD-10-CM | POA: Diagnosis not present

## 2022-07-20 DIAGNOSIS — M6281 Muscle weakness (generalized): Secondary | ICD-10-CM | POA: Diagnosis not present

## 2022-07-20 DIAGNOSIS — M25512 Pain in left shoulder: Secondary | ICD-10-CM | POA: Diagnosis not present

## 2022-07-20 DIAGNOSIS — Z7409 Other reduced mobility: Secondary | ICD-10-CM | POA: Diagnosis not present

## 2022-07-20 DIAGNOSIS — B078 Other viral warts: Secondary | ICD-10-CM | POA: Diagnosis not present

## 2022-07-20 DIAGNOSIS — S43432D Superior glenoid labrum lesion of left shoulder, subsequent encounter: Secondary | ICD-10-CM | POA: Diagnosis not present

## 2022-07-22 DIAGNOSIS — T63451D Toxic effect of venom of hornets, accidental (unintentional), subsequent encounter: Secondary | ICD-10-CM | POA: Diagnosis not present

## 2022-07-22 DIAGNOSIS — T63461D Toxic effect of venom of wasps, accidental (unintentional), subsequent encounter: Secondary | ICD-10-CM | POA: Diagnosis not present

## 2022-07-27 DIAGNOSIS — T63451A Toxic effect of venom of hornets, accidental (unintentional), initial encounter: Secondary | ICD-10-CM | POA: Diagnosis not present

## 2022-07-27 DIAGNOSIS — M25512 Pain in left shoulder: Secondary | ICD-10-CM | POA: Diagnosis not present

## 2022-07-27 DIAGNOSIS — S43432D Superior glenoid labrum lesion of left shoulder, subsequent encounter: Secondary | ICD-10-CM | POA: Diagnosis not present

## 2022-07-27 DIAGNOSIS — M6281 Muscle weakness (generalized): Secondary | ICD-10-CM | POA: Diagnosis not present

## 2022-07-27 DIAGNOSIS — T63461A Toxic effect of venom of wasps, accidental (unintentional), initial encounter: Secondary | ICD-10-CM | POA: Diagnosis not present

## 2022-07-27 DIAGNOSIS — Z7409 Other reduced mobility: Secondary | ICD-10-CM | POA: Diagnosis not present

## 2022-07-29 DIAGNOSIS — M25512 Pain in left shoulder: Secondary | ICD-10-CM | POA: Diagnosis not present

## 2022-07-29 DIAGNOSIS — M6281 Muscle weakness (generalized): Secondary | ICD-10-CM | POA: Diagnosis not present

## 2022-07-29 DIAGNOSIS — S43432D Superior glenoid labrum lesion of left shoulder, subsequent encounter: Secondary | ICD-10-CM | POA: Diagnosis not present

## 2022-07-29 DIAGNOSIS — Z7409 Other reduced mobility: Secondary | ICD-10-CM | POA: Diagnosis not present

## 2022-08-05 DIAGNOSIS — S43432D Superior glenoid labrum lesion of left shoulder, subsequent encounter: Secondary | ICD-10-CM | POA: Diagnosis not present

## 2022-08-05 DIAGNOSIS — M6281 Muscle weakness (generalized): Secondary | ICD-10-CM | POA: Diagnosis not present

## 2022-08-05 DIAGNOSIS — Z7409 Other reduced mobility: Secondary | ICD-10-CM | POA: Diagnosis not present

## 2022-08-05 DIAGNOSIS — M25512 Pain in left shoulder: Secondary | ICD-10-CM | POA: Diagnosis not present

## 2022-08-09 DIAGNOSIS — D2371 Other benign neoplasm of skin of right lower limb, including hip: Secondary | ICD-10-CM | POA: Diagnosis not present

## 2022-08-09 DIAGNOSIS — L821 Other seborrheic keratosis: Secondary | ICD-10-CM | POA: Diagnosis not present

## 2022-08-09 DIAGNOSIS — Z129 Encounter for screening for malignant neoplasm, site unspecified: Secondary | ICD-10-CM | POA: Diagnosis not present

## 2022-08-09 DIAGNOSIS — B078 Other viral warts: Secondary | ICD-10-CM | POA: Diagnosis not present

## 2022-08-30 DIAGNOSIS — Z4789 Encounter for other orthopedic aftercare: Secondary | ICD-10-CM | POA: Diagnosis not present

## 2022-08-31 DIAGNOSIS — M6281 Muscle weakness (generalized): Secondary | ICD-10-CM | POA: Diagnosis not present

## 2022-08-31 DIAGNOSIS — M25512 Pain in left shoulder: Secondary | ICD-10-CM | POA: Diagnosis not present

## 2022-08-31 DIAGNOSIS — S43432D Superior glenoid labrum lesion of left shoulder, subsequent encounter: Secondary | ICD-10-CM | POA: Diagnosis not present

## 2022-08-31 DIAGNOSIS — Z7409 Other reduced mobility: Secondary | ICD-10-CM | POA: Diagnosis not present

## 2022-09-01 DIAGNOSIS — T63451A Toxic effect of venom of hornets, accidental (unintentional), initial encounter: Secondary | ICD-10-CM | POA: Diagnosis not present

## 2022-09-01 DIAGNOSIS — T63461A Toxic effect of venom of wasps, accidental (unintentional), initial encounter: Secondary | ICD-10-CM | POA: Diagnosis not present

## 2022-09-02 DIAGNOSIS — M25512 Pain in left shoulder: Secondary | ICD-10-CM | POA: Diagnosis not present

## 2022-09-02 DIAGNOSIS — M6281 Muscle weakness (generalized): Secondary | ICD-10-CM | POA: Diagnosis not present

## 2022-09-02 DIAGNOSIS — Z7409 Other reduced mobility: Secondary | ICD-10-CM | POA: Diagnosis not present

## 2022-09-02 DIAGNOSIS — S43432D Superior glenoid labrum lesion of left shoulder, subsequent encounter: Secondary | ICD-10-CM | POA: Diagnosis not present

## 2022-09-07 DIAGNOSIS — S43432D Superior glenoid labrum lesion of left shoulder, subsequent encounter: Secondary | ICD-10-CM | POA: Diagnosis not present

## 2022-09-07 DIAGNOSIS — Z7409 Other reduced mobility: Secondary | ICD-10-CM | POA: Diagnosis not present

## 2022-09-07 DIAGNOSIS — M25512 Pain in left shoulder: Secondary | ICD-10-CM | POA: Diagnosis not present

## 2022-09-07 DIAGNOSIS — M6281 Muscle weakness (generalized): Secondary | ICD-10-CM | POA: Diagnosis not present

## 2022-09-09 DIAGNOSIS — M25512 Pain in left shoulder: Secondary | ICD-10-CM | POA: Diagnosis not present

## 2022-09-09 DIAGNOSIS — Z7409 Other reduced mobility: Secondary | ICD-10-CM | POA: Diagnosis not present

## 2022-09-09 DIAGNOSIS — S43432D Superior glenoid labrum lesion of left shoulder, subsequent encounter: Secondary | ICD-10-CM | POA: Diagnosis not present

## 2022-09-09 DIAGNOSIS — M6281 Muscle weakness (generalized): Secondary | ICD-10-CM | POA: Diagnosis not present

## 2022-09-15 DIAGNOSIS — Z7409 Other reduced mobility: Secondary | ICD-10-CM | POA: Diagnosis not present

## 2022-09-15 DIAGNOSIS — M25512 Pain in left shoulder: Secondary | ICD-10-CM | POA: Diagnosis not present

## 2022-09-15 DIAGNOSIS — M6281 Muscle weakness (generalized): Secondary | ICD-10-CM | POA: Diagnosis not present

## 2022-09-15 DIAGNOSIS — S43432D Superior glenoid labrum lesion of left shoulder, subsequent encounter: Secondary | ICD-10-CM | POA: Diagnosis not present

## 2022-09-19 DIAGNOSIS — M25512 Pain in left shoulder: Secondary | ICD-10-CM | POA: Diagnosis not present

## 2022-09-19 DIAGNOSIS — S43432D Superior glenoid labrum lesion of left shoulder, subsequent encounter: Secondary | ICD-10-CM | POA: Diagnosis not present

## 2022-09-19 DIAGNOSIS — Z7409 Other reduced mobility: Secondary | ICD-10-CM | POA: Diagnosis not present

## 2022-09-19 DIAGNOSIS — M6281 Muscle weakness (generalized): Secondary | ICD-10-CM | POA: Diagnosis not present

## 2022-10-06 DIAGNOSIS — L500 Allergic urticaria: Secondary | ICD-10-CM | POA: Diagnosis not present

## 2022-10-06 DIAGNOSIS — Z720 Tobacco use: Secondary | ICD-10-CM | POA: Diagnosis not present

## 2022-10-06 DIAGNOSIS — T63451A Toxic effect of venom of hornets, accidental (unintentional), initial encounter: Secondary | ICD-10-CM | POA: Diagnosis not present

## 2022-10-06 DIAGNOSIS — T63461A Toxic effect of venom of wasps, accidental (unintentional), initial encounter: Secondary | ICD-10-CM | POA: Diagnosis not present

## 2022-10-07 DIAGNOSIS — Z7409 Other reduced mobility: Secondary | ICD-10-CM | POA: Diagnosis not present

## 2022-10-07 DIAGNOSIS — M25512 Pain in left shoulder: Secondary | ICD-10-CM | POA: Diagnosis not present

## 2022-10-07 DIAGNOSIS — M6281 Muscle weakness (generalized): Secondary | ICD-10-CM | POA: Diagnosis not present

## 2022-10-07 DIAGNOSIS — S43432D Superior glenoid labrum lesion of left shoulder, subsequent encounter: Secondary | ICD-10-CM | POA: Diagnosis not present

## 2022-10-12 DIAGNOSIS — Z7409 Other reduced mobility: Secondary | ICD-10-CM | POA: Diagnosis not present

## 2022-10-12 DIAGNOSIS — S43432D Superior glenoid labrum lesion of left shoulder, subsequent encounter: Secondary | ICD-10-CM | POA: Diagnosis not present

## 2022-10-12 DIAGNOSIS — M25512 Pain in left shoulder: Secondary | ICD-10-CM | POA: Diagnosis not present

## 2022-10-12 DIAGNOSIS — M6281 Muscle weakness (generalized): Secondary | ICD-10-CM | POA: Diagnosis not present

## 2022-10-21 DIAGNOSIS — M6281 Muscle weakness (generalized): Secondary | ICD-10-CM | POA: Diagnosis not present

## 2022-10-21 DIAGNOSIS — Z7409 Other reduced mobility: Secondary | ICD-10-CM | POA: Diagnosis not present

## 2022-10-21 DIAGNOSIS — S43432D Superior glenoid labrum lesion of left shoulder, subsequent encounter: Secondary | ICD-10-CM | POA: Diagnosis not present

## 2022-10-21 DIAGNOSIS — M25512 Pain in left shoulder: Secondary | ICD-10-CM | POA: Diagnosis not present

## 2022-10-28 DIAGNOSIS — Z7409 Other reduced mobility: Secondary | ICD-10-CM | POA: Diagnosis not present

## 2022-10-28 DIAGNOSIS — M25512 Pain in left shoulder: Secondary | ICD-10-CM | POA: Diagnosis not present

## 2022-10-28 DIAGNOSIS — S43432D Superior glenoid labrum lesion of left shoulder, subsequent encounter: Secondary | ICD-10-CM | POA: Diagnosis not present

## 2022-10-28 DIAGNOSIS — M6281 Muscle weakness (generalized): Secondary | ICD-10-CM | POA: Diagnosis not present

## 2022-11-06 IMAGING — DX DG LUMBAR SPINE COMPLETE 4+V
5 series · 5 of 5 positions shown · non-contrast
Comparison: None.

CLINICAL DATA: Back pain.

EXAM:
LUMBAR SPINE - COMPLETE 4+ VIEW

[l-spine ap]
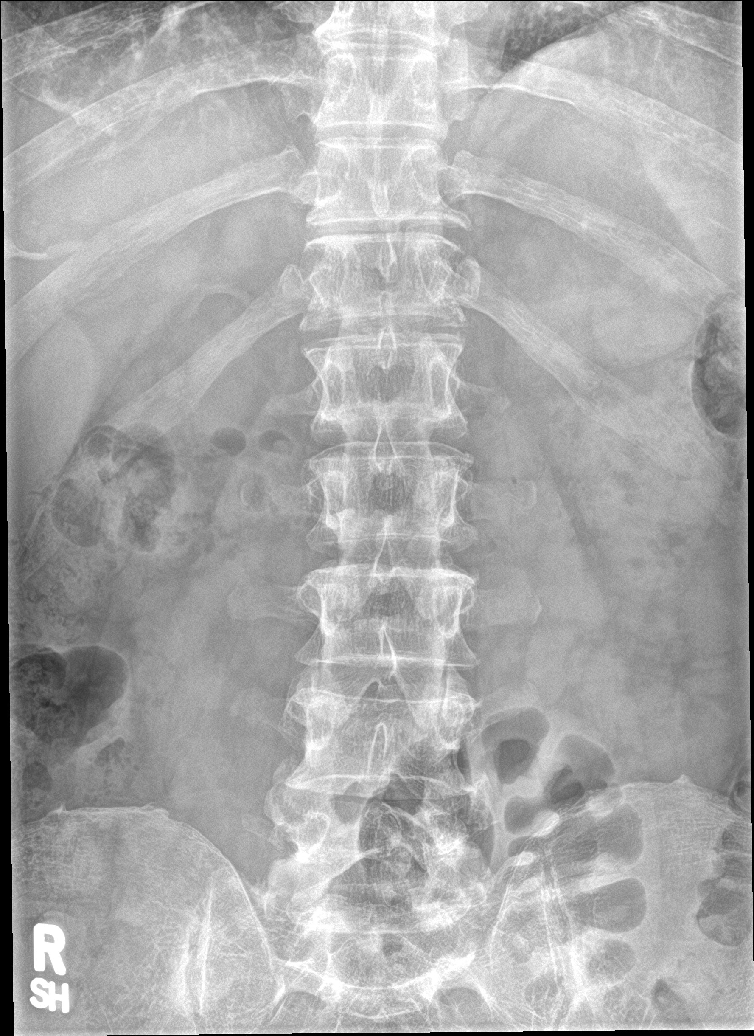

[l-spine obl (1 of 2)]
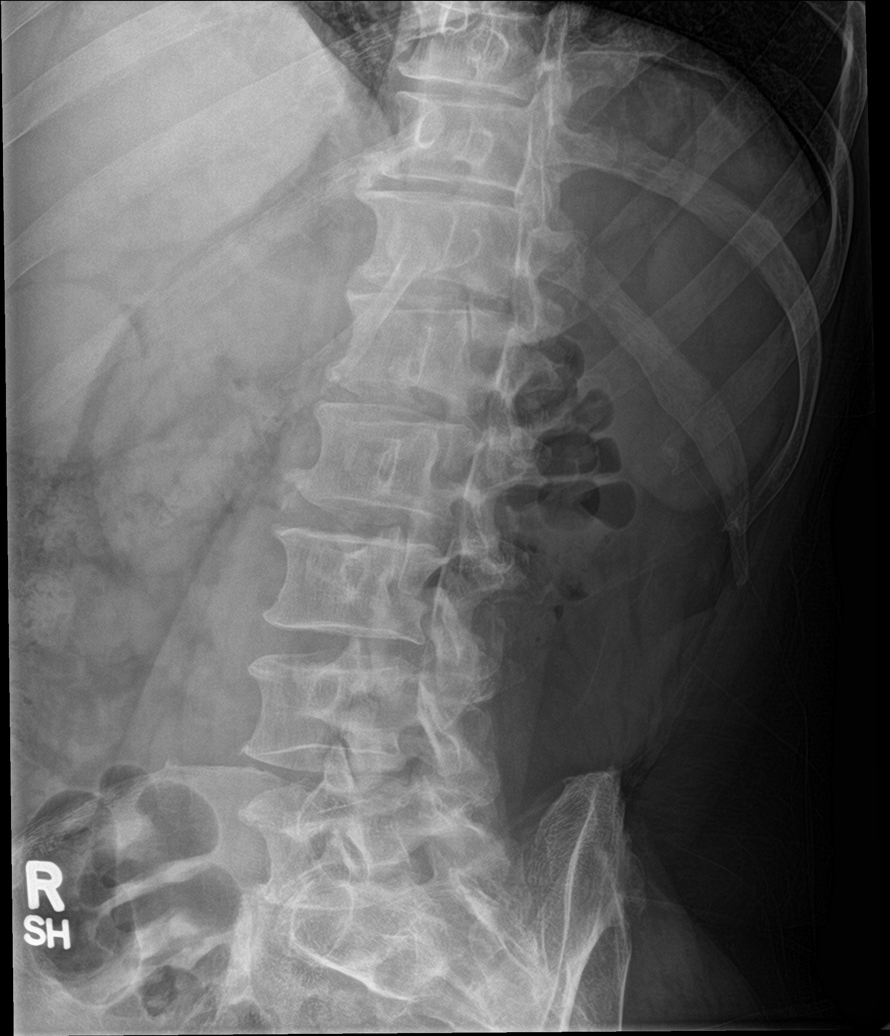

[l-spine obl (2 of 2)]
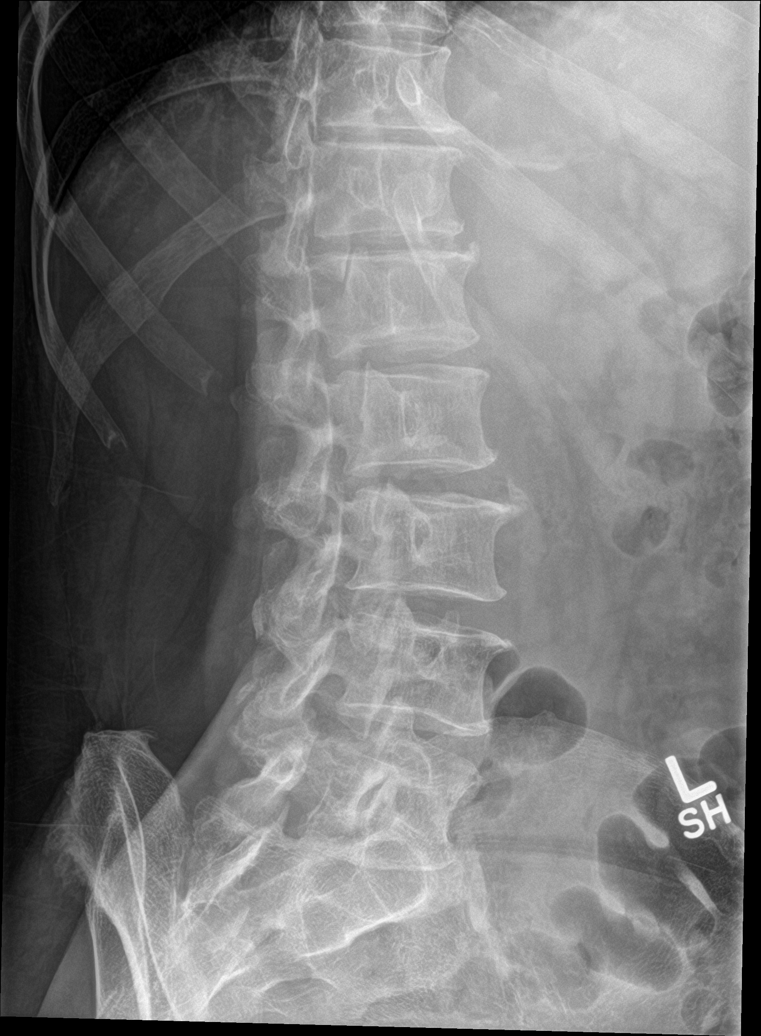

[l-spine lat]
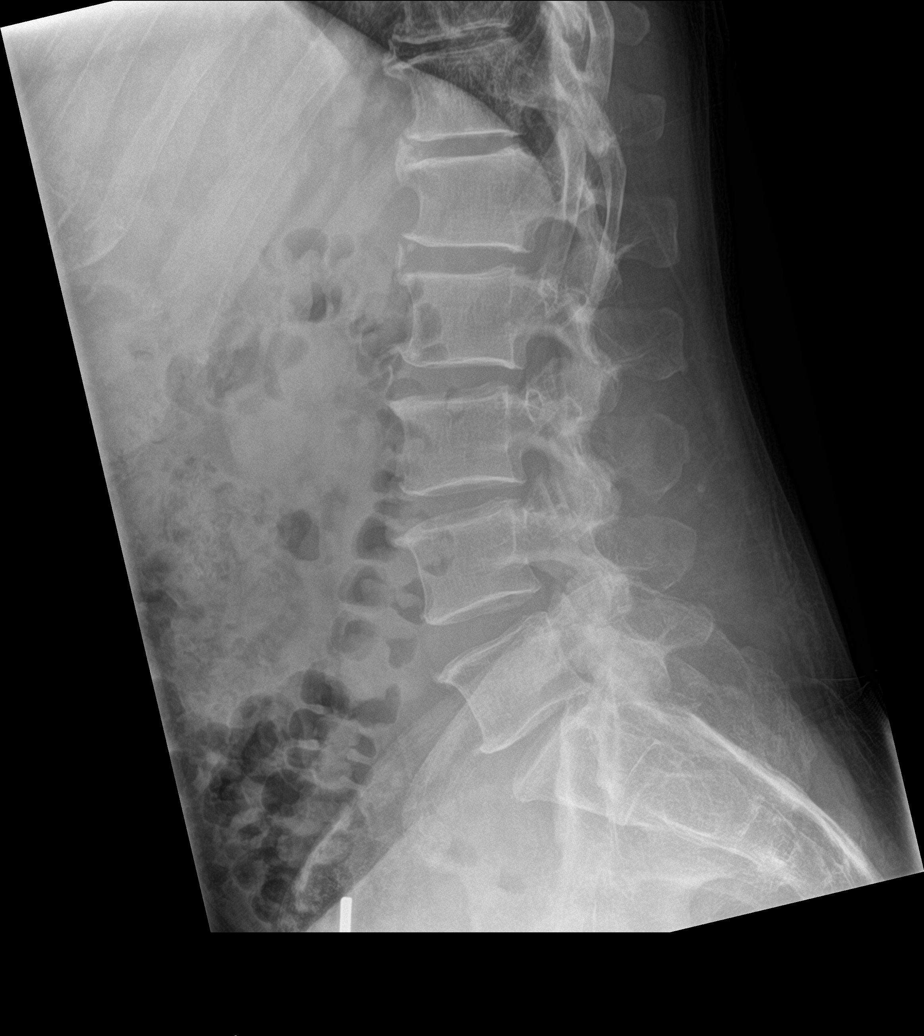

[l-spine spot]
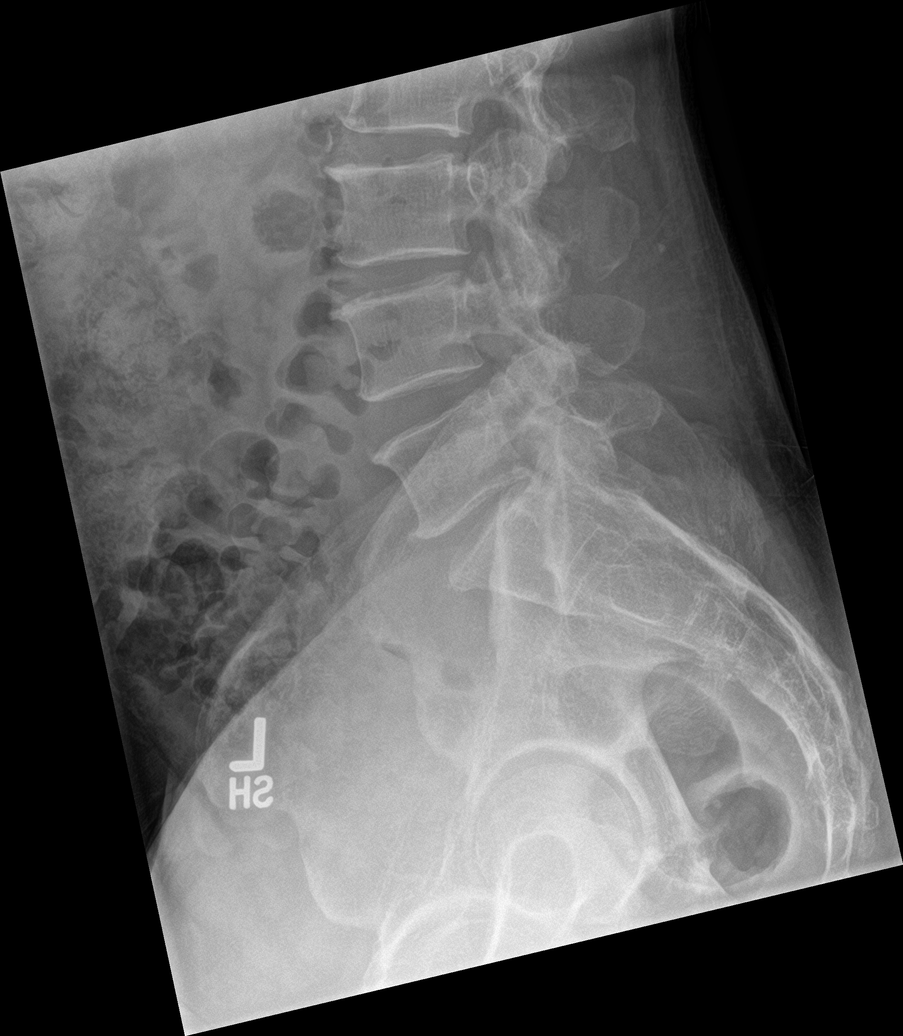

[5 of 5 positions shown; findings below may reference images not displayed]

FINDINGS: Five lumbar type vertebra. There is no acute fracture or subluxation
of the lumbar spine. Multilevel degenerative changes with spurring.
The visualized posterior elements are intact. The soft tissues are
unremarkable.
IMPRESSION: No acute/traumatic lumbar spine pathology. Multilevel degenerative
changes.

## 2022-11-08 DIAGNOSIS — T63461A Toxic effect of venom of wasps, accidental (unintentional), initial encounter: Secondary | ICD-10-CM | POA: Diagnosis not present

## 2022-11-08 DIAGNOSIS — T63451A Toxic effect of venom of hornets, accidental (unintentional), initial encounter: Secondary | ICD-10-CM | POA: Diagnosis not present

## 2022-12-20 DIAGNOSIS — T63461A Toxic effect of venom of wasps, accidental (unintentional), initial encounter: Secondary | ICD-10-CM | POA: Diagnosis not present

## 2022-12-20 DIAGNOSIS — T63451A Toxic effect of venom of hornets, accidental (unintentional), initial encounter: Secondary | ICD-10-CM | POA: Diagnosis not present

## 2022-12-31 ENCOUNTER — Other Ambulatory Visit: Payer: Self-pay | Admitting: Family Medicine

## 2022-12-31 DIAGNOSIS — N529 Male erectile dysfunction, unspecified: Secondary | ICD-10-CM

## 2023-01-03 ENCOUNTER — Encounter: Payer: Self-pay | Admitting: Family Medicine

## 2023-01-04 ENCOUNTER — Other Ambulatory Visit: Payer: Self-pay | Admitting: Family Medicine

## 2023-01-04 DIAGNOSIS — N529 Male erectile dysfunction, unspecified: Secondary | ICD-10-CM

## 2023-01-04 MED ORDER — SILDENAFIL CITRATE 20 MG PO TABS: ORAL_TABLET | ORAL | 0 refills | Status: DC

## 2023-01-09 DIAGNOSIS — T63451D Toxic effect of venom of hornets, accidental (unintentional), subsequent encounter: Secondary | ICD-10-CM | POA: Diagnosis not present

## 2023-01-09 DIAGNOSIS — T63461D Toxic effect of venom of wasps, accidental (unintentional), subsequent encounter: Secondary | ICD-10-CM | POA: Diagnosis not present

## 2023-01-10 ENCOUNTER — Encounter: Payer: Self-pay | Admitting: Family Medicine

## 2023-01-10 ENCOUNTER — Ambulatory Visit (INDEPENDENT_AMBULATORY_CARE_PROVIDER_SITE_OTHER): Payer: Federal, State, Local not specified - PPO | Admitting: Family Medicine

## 2023-01-10 VITALS — BP 136/76 | HR 65 | Ht 66.93 in | Wt 177.0 lb

## 2023-01-10 DIAGNOSIS — Z0000 Encounter for general adult medical examination without abnormal findings: Secondary | ICD-10-CM

## 2023-01-10 DIAGNOSIS — Z1211 Encounter for screening for malignant neoplasm of colon: Secondary | ICD-10-CM

## 2023-01-10 DIAGNOSIS — Z13220 Encounter for screening for lipoid disorders: Secondary | ICD-10-CM | POA: Diagnosis not present

## 2023-01-10 DIAGNOSIS — N529 Male erectile dysfunction, unspecified: Secondary | ICD-10-CM

## 2023-01-10 NOTE — Assessment & Plan Note (Signed)
Well adult Orders Placed This Encounter  Procedures   CMP14+EGFR   CBC with Differential/Platelet   Lipid Panel With LDL/HDL Ratio   Vitamin D (25 hydroxy)   Testosterone   TSH   Ambulatory referral to Gastroenterology    Referral Priority:   Routine    Referral Type:   Consultation    Referral Reason:   Specialty Services Required    Number of Visits Requested:   1  Screenings: UTD Immunizations: UTD Anticipatory guidance/risk factor reduction:  Recommendations per AVS.

## 2023-01-10 NOTE — Progress Notes (Signed)
Matthew Marks - 47 y.o. male MRN [id number]  Date of birth: [birth year]  Subjective Chief Complaint  Patient presents with   Annual Exam   Abdominal Pain    HPI Matthew Marks is a 47 y.o. male here today for annual exam.   Reports that he is doing pretty well.  He is having some intermittent periumbilical pain.  Worse post-prandial. Denies nausea.  He has never had colon cancer screening.   He continues to stay pretty active.  He feels that diet is pretty good most of the time.   Non-smoker.  Moderate EtOH use during the week.   Review of Systems  Constitutional:  Negative for chills, fever, malaise/fatigue and weight loss.  HENT:  Negative for congestion, ear pain and sore throat.   Eyes:  Negative for blurred vision, double vision and pain.  Respiratory:  Negative for cough and shortness of breath.   Cardiovascular:  Negative for chest pain and palpitations.  Gastrointestinal:  Negative for abdominal pain, blood in stool, constipation, heartburn and nausea.  Genitourinary:  Negative for dysuria and urgency.  Musculoskeletal:  Negative for joint pain and myalgias.  Neurological:  Negative for dizziness and headaches.  Endo/Heme/Allergies:  Does not bruise/bleed easily.  Psychiatric/Behavioral:  Negative for depression. The patient is not nervous/anxious and does not have insomnia.     Allergies  Allergen Reactions   Bee Venom Anaphylaxis    Past Medical History:  Diagnosis Date   H/O chronic ear infection 07/04/2014    Past Surgical History:  Procedure Laterality Date   BACK SURGERY     left knee surgery ligament tension     TYMPANOSTOMY TUBE PLACEMENT     as a child and as an adult    Social History   Socioeconomic History   Marital status: Married    Spouse name: Not on file   Number of children: Not on file   Years of education: Not on file   Highest education level: Not on file  Occupational History   Not on file  Tobacco Use   Smoking status: Never    Smokeless tobacco: Never  Vaping Use   Vaping status: Never Used  Substance and Sexual Activity   Alcohol use: Yes    Alcohol/week: 12.0 standard drinks of alcohol    Types: 12 Standard drinks or equivalent per week   Drug use: No   Sexual activity: Yes    Partners: Female  Other Topics Concern   Not on file  Social History Narrative   Not on file   Social Determinants of Health   Financial Resource Strain: Not on file  Food Insecurity: Not on file  Transportation Needs: Not on file  Physical Activity: Not on file  Stress: Not on file  Social Connections: Unknown (07/19/2021)   Received from [facility], [facility]   Social Network    Social Network: Not on file    Family History  Problem Relation Age of Onset   Prostate cancer Father    Hypertension Father    Diabetes Mother     Health Maintenance  Topic Date Due   Hepatitis C Screening  Never done   Colonoscopy  Never done   COVID-19 Vaccine (3 - 2023-24 season) 01/26/2023 (Originally 11/06/2022)   INFLUENZA VACCINE  06/05/2023 (Originally 10/06/2022)   DTaP/Tdap/Td (3 - Td or Tdap) 07/21/2031   HIV Screening  Completed   HPV VACCINES  Aged Out     ----------------------------------------------------------------------------------------------------------------------------------------------------------------------------------------------------------------- Physical Exam BP 136/76 (BP Location:  Left Arm, Patient Position: Sitting, Cuff Size: Normal)   Pulse 65   Ht 5' 6.93" (1.7 m)   Wt 177 lb (80.3 kg)   SpO2 96%   BMI 27.78 kg/m   Physical Exam Constitutional:      General: He is not in acute distress. HENT:     Head: Normocephalic and atraumatic.     Right Ear: Tympanic membrane and external ear normal.     Left Ear: Tympanic membrane and external ear normal.  Eyes:     General: No scleral icterus. Neck:     Thyroid: No thyromegaly.  Cardiovascular:     Rate and Rhythm: Normal rate and regular  rhythm.     Heart sounds: Normal heart sounds.  Pulmonary:     Effort: Pulmonary effort is normal.     Breath sounds: Normal breath sounds.  Abdominal:     General: Bowel sounds are normal. There is no distension.     Palpations: Abdomen is soft.     Tenderness: There is no abdominal tenderness. There is no guarding.  Musculoskeletal:     Cervical back: Normal range of motion.  Lymphadenopathy:     Cervical: No cervical adenopathy.  Skin:    General: Skin is warm and dry.     Findings: No rash.  Neurological:     Mental Status: He is alert and oriented to person, place, and time.     Cranial Nerves: No cranial nerve deficit.     Motor: No abnormal muscle tone.  Psychiatric:        Mood and Affect: Mood normal.        Behavior: Behavior normal.     ------------------------------------------------------------------------------------------------------------------------------------------------------------------------------------------------------------------- Assessment and Plan  Well adult exam Well adult Orders Placed This Encounter  Procedures   CMP14+EGFR   CBC with Differential/Platelet   Lipid Panel With LDL/HDL Ratio   Vitamin D (25 hydroxy)   Testosterone   TSH   Ambulatory referral to Gastroenterology    Referral Priority:   Routine    Referral Type:   Consultation    Referral Reason:   Specialty Services Required    Number of Visits Requested:   1  Screenings: UTD Immunizations: UTD Anticipatory guidance/risk factor reduction:  Recommendations per AVS.     No orders of the defined types were placed in this encounter.   No follow-ups on file.    This visit occurred during the SARS-CoV-2 public health emergency.  Safety protocols were in place, including screening questions prior to the visit, additional usage of staff PPE, and extensive cleaning of exam room while observing appropriate contact time as indicated for disinfecting solutions.

## 2023-01-11 ENCOUNTER — Other Ambulatory Visit: Payer: Self-pay | Admitting: Family Medicine

## 2023-01-11 DIAGNOSIS — N529 Male erectile dysfunction, unspecified: Secondary | ICD-10-CM

## 2023-01-11 LAB — CMP14+EGFR
ALT: 17 [IU]/L (ref 0–44)
AST: 15 [IU]/L (ref 0–40)
Albumin: 4.2 g/dL (ref 4.1–5.1)
Alkaline Phosphatase: 62 [IU]/L (ref 44–121)
BUN/Creatinine Ratio: 17 (ref 9–20)
BUN: 16 mg/dL (ref 6–24)
Bilirubin Total: 0.4 mg/dL (ref 0.0–1.2)
CO2: 22 mmol/L (ref 20–29)
Calcium: 8.9 mg/dL (ref 8.7–10.2)
Chloride: 106 mmol/L (ref 96–106)
Creatinine, Ser: 0.94 mg/dL (ref 0.76–1.27)
Globulin, Total: 2.1 g/dL (ref 1.5–4.5)
Glucose: 83 mg/dL (ref 70–99)
Potassium: 4.1 mmol/L (ref 3.5–5.2)
Sodium: 141 mmol/L (ref 134–144)
Total Protein: 6.3 g/dL (ref 6.0–8.5)
eGFR: 99 mL/min/{1.73_m2} (ref >59)

## 2023-01-11 LAB — CBC WITH DIFFERENTIAL/PLATELET
Basophils Absolute: 0.1 10*3/uL (ref 0.0–0.2)
Basos: 1 %
EOS (ABSOLUTE): 0.2 10*3/uL (ref 0.0–0.4)
Eos: 3 %
Hematocrit: 43.4 % (ref 37.5–51.0)
Hemoglobin: 14.7 g/dL (ref 13.0–17.7)
Immature Grans (Abs): 0 10*3/uL (ref 0.0–0.1)
Immature Granulocytes: 1 %
Lymphocytes Absolute: 2.1 10*3/uL (ref 0.7–3.1)
Lymphs: 35 %
MCH: 30.8 pg (ref 26.6–33.0)
MCHC: 33.9 g/dL (ref 31.5–35.7)
MCV: 91 fL (ref 79–97)
Monocytes Absolute: 0.5 10*3/uL (ref 0.1–0.9)
Monocytes: 9 %
Neutrophils Absolute: 3 10*3/uL (ref 1.4–7.0)
Neutrophils: 51 %
Platelets: 244 10*3/uL (ref 150–450)
RBC: 4.78 x10E6/uL (ref 4.14–5.80)
RDW: 12.1 % (ref 11.6–15.4)
WBC: 5.9 10*3/uL (ref 3.4–10.8)

## 2023-01-11 LAB — VITAMIN D 25 HYDROXY (VIT D DEFICIENCY, FRACTURES): Vit D, 25-Hydroxy: 39.5 ng/mL (ref 30.0–100.0)

## 2023-01-11 LAB — LIPID PANEL WITH LDL/HDL RATIO
Cholesterol, Total: 183 mg/dL (ref 100–199)
HDL: 55 mg/dL (ref >39)
LDL Chol Calc (NIH): 110 mg/dL — ABNORMAL HIGH (ref 0–99)
LDL/HDL Ratio: 2 ratio (ref 0.0–3.6)
Triglycerides: 100 mg/dL (ref 0–149)
VLDL Cholesterol Cal: 18 mg/dL (ref 5–40)

## 2023-01-11 LAB — TSH: TSH: 2.45 u[IU]/mL (ref 0.450–4.500)

## 2023-01-11 LAB — TESTOSTERONE: Testosterone: 378 ng/dL (ref 264–916)

## 2023-01-31 DIAGNOSIS — T63451A Toxic effect of venom of hornets, accidental (unintentional), initial encounter: Secondary | ICD-10-CM | POA: Diagnosis not present

## 2023-01-31 DIAGNOSIS — T63461A Toxic effect of venom of wasps, accidental (unintentional), initial encounter: Secondary | ICD-10-CM | POA: Diagnosis not present

## 2023-03-13 DIAGNOSIS — K644 Residual hemorrhoidal skin tags: Secondary | ICD-10-CM | POA: Diagnosis not present

## 2023-03-13 DIAGNOSIS — K5730 Diverticulosis of large intestine without perforation or abscess without bleeding: Secondary | ICD-10-CM | POA: Diagnosis not present

## 2023-03-13 DIAGNOSIS — K648 Other hemorrhoids: Secondary | ICD-10-CM | POA: Diagnosis not present

## 2023-03-13 DIAGNOSIS — Z1211 Encounter for screening for malignant neoplasm of colon: Secondary | ICD-10-CM | POA: Diagnosis not present

## 2023-03-14 DIAGNOSIS — T63451A Toxic effect of venom of hornets, accidental (unintentional), initial encounter: Secondary | ICD-10-CM | POA: Diagnosis not present

## 2023-03-14 DIAGNOSIS — T63461A Toxic effect of venom of wasps, accidental (unintentional), initial encounter: Secondary | ICD-10-CM | POA: Diagnosis not present

## 2023-03-16 ENCOUNTER — Other Ambulatory Visit: Payer: Self-pay | Admitting: Family Medicine

## 2023-03-16 DIAGNOSIS — N529 Male erectile dysfunction, unspecified: Secondary | ICD-10-CM

## 2023-04-25 DIAGNOSIS — T63461A Toxic effect of venom of wasps, accidental (unintentional), initial encounter: Secondary | ICD-10-CM | POA: Diagnosis not present

## 2023-04-25 DIAGNOSIS — T63451A Toxic effect of venom of hornets, accidental (unintentional), initial encounter: Secondary | ICD-10-CM | POA: Diagnosis not present

## 2023-06-05 ENCOUNTER — Ambulatory Visit: Payer: Self-pay

## 2023-06-05 NOTE — Telephone Encounter (Signed)
  Chief Complaint: hypertension Symptoms: intermittent mild SOB with exertion, intermittent mild headaches, elevated BP readings Frequency: intermittent since January Pertinent Negatives: Patient denies chest pain, numbness or weakness, changes in speech or vision, dizziness/unsteady gait. Disposition: [] ED /[] Urgent Care (no appt availability in office) / [x] Appointment(In office/virtual)/ []  [facility]/ [] Home Care/ [] Refused Recommended Disposition /[] [facility]/ []  Follow-up with PCP Additional Notes: Patient states he had a colonoscopy done in January and the nurse mentioned to him he should check his BP regularly as it was elevated that day. He states he has been having elevated readings at home, including today with 146/79 and 136/95. Patient states he drinks alcohol daily and occasionally smokes cigars. Patient agreeable to acute visit tomorrow with PCP. Patient verbalizes understanding to call back for new or worsening symptoms.  Copied from CRM [id number]. Topic: Clinical - Red Word Triage >> Jun 05, 2023  2:59 PM Prudencio Pair wrote: Red Word that prompted transfer to Nurse Triage: Patient wants to see pcp due to high blood pressure. States he just checked it and it's now 136/95 with a pulse of 65. Reason for Disposition  [1] Systolic BP  >= 130 OR Diastolic >= 80 AND [2] not taking BP medications  Answer Assessment - Initial Assessment Questions 1. BLOOD PRESSURE: "What is the blood pressure?" "Did you take at least two measurements 5 minutes apart?"     146/79 and then 136/95.  2. ONSET: "When did you take your blood pressure?"     He states it has been elevated since January. He states he took the first reading this morning and then second reading a few minutes prior to calling in.  3. HOW: "How did you take your blood pressure?" (e.g., automatic home BP monitor, visiting nurse)     Automatic home BP monitor.  4. HISTORY: "Do you have a history of high blood  pressure?"     Denies.  5. MEDICINES: "Are you taking any medicines for blood pressure?" "Have you missed any doses recently?"     Denies taking any BP meds.  6. OTHER SYMPTOMS: "Do you have any symptoms?" (e.g., blurred vision, chest pain, difficulty breathing, headache, weakness)     He states mild SOB when walking or working in the yard, intermittent mild headache.  7. PREGNANCY: "Is there any chance you are pregnant?" "When was your last menstrual period?"     N/A.  Protocols used: Blood Pressure - High-A-AH

## 2023-06-06 ENCOUNTER — Encounter: Payer: Self-pay | Admitting: Family Medicine

## 2023-06-06 ENCOUNTER — Ambulatory Visit: Admitting: Family Medicine

## 2023-06-06 VITALS — BP 121/79 | HR 64 | Ht 66.93 in | Wt 178.0 lb

## 2023-06-06 DIAGNOSIS — R0609 Other forms of dyspnea: Secondary | ICD-10-CM

## 2023-06-06 DIAGNOSIS — T63451A Toxic effect of venom of hornets, accidental (unintentional), initial encounter: Secondary | ICD-10-CM | POA: Diagnosis not present

## 2023-06-06 DIAGNOSIS — R030 Elevated blood-pressure reading, without diagnosis of hypertension: Secondary | ICD-10-CM

## 2023-06-06 DIAGNOSIS — T63461A Toxic effect of venom of wasps, accidental (unintentional), initial encounter: Secondary | ICD-10-CM | POA: Diagnosis not present

## 2023-06-06 NOTE — Assessment & Plan Note (Signed)
 BP elevated at home at times.  Normal reading here in clinic.  I asked him to record home readings for the next couple of weeks.  Return in 2 weeks for nurse visit and bring home cuff for comparison.  Checking CMP, CBC and TSH today.  He has had some intermittent dyspnea.  EKG is normal.  Discussed coronary calcium scoring.  He will consider this.

## 2023-06-06 NOTE — Progress Notes (Signed)
 Matthew Marks - 48 y.o. male MRN [id number]  Date of birth: Mar 05, 1976  Subjective Chief Complaint  Patient presents with   Shortness of Breath   Wheezing    HPI Matthew Marks is a 48 y.o. male here today with concerns of elevated BP readings at home.  He reports that BP was elevated at colonoscopy appt a few months ago so he has been monitoring intermittently over the past few months.  BP elevated over the past week or so but this has been fluctuating.  Some days BP is normal and some days he has readings in the 140-150's systolic.  He has had some mild exertional dyspnea.  Denies chest pain, palpitations, headache or vision changes. Maybe a little more stressed but denies changes to diet or increased EtOH use.   ROS:  A comprehensive ROS was completed and negative except as noted per HPI  Allergies  Allergen Reactions   Bee Venom Anaphylaxis    Past Medical History:  Diagnosis Date   H/O chronic ear infection 07/04/2014    Past Surgical History:  Procedure Laterality Date   BACK SURGERY     left knee surgery ligament tension     TYMPANOSTOMY TUBE PLACEMENT     as a child and as an adult    Social History   Socioeconomic History   Marital status: Married    Spouse name: Not on file   Number of children: Not on file   Years of education: Not on file   Highest education level: Not on file  Occupational History   Not on file  Tobacco Use   Smoking status: Never   Smokeless tobacco: Never  Vaping Use   Vaping status: Never Used  Substance and Sexual Activity   Alcohol use: Yes    Alcohol/week: 12.0 standard drinks of alcohol    Types: 12 Standard drinks or equivalent per week   Drug use: No   Sexual activity: Yes    Partners: Female  Other Topics Concern   Not on file  Social History Narrative   Not on file   Social Drivers of Health   Financial Resource Strain: Not on file  Food Insecurity: Not on file  Transportation Needs: Not on file  Physical Activity: Not  on file  Stress: Not on file  Social Connections: Unknown (07/19/2021)   Received from [facility], [facility]   Social Network    Social Network: Not on file    Family History  Problem Relation Age of Onset   Prostate cancer Father    Hypertension Father    Diabetes Mother     Health Maintenance  Topic Date Due   Hepatitis C Screening  Never done   Colonoscopy  Never done   COVID-19 Vaccine (3 - 2024-25 season) 11/06/2022   Zoster Vaccines- Shingrix (1 of 2) Never done   INFLUENZA VACCINE  10/06/2023   DTaP/Tdap/Td (3 - Td or Tdap) 07/21/2031   HIV Screening  Completed   HPV VACCINES  Aged Out     ----------------------------------------------------------------------------------------------------------------------------------------------------------------------------------------------------------------- Physical Exam BP 121/79 (BP Location: Left Arm, Patient Position: Sitting, Cuff Size: Normal)   Pulse 64   Ht 5' 6.93" (1.7 m)   Wt 178 lb (80.7 kg)   SpO2 98%   BMI 27.94 kg/m   Physical Exam Constitutional:      Appearance: Normal appearance.  HENT:     Head: Normocephalic and atraumatic.  Eyes:     General: No scleral icterus. Cardiovascular:  Rate and Rhythm: Normal rate and regular rhythm.  Pulmonary:     Effort: Pulmonary effort is normal.     Breath sounds: Normal breath sounds.  Musculoskeletal:     Cervical back: Neck supple.  Neurological:     Mental Status: He is alert.  Psychiatric:        Mood and Affect: Mood normal.        Behavior: Behavior normal.    EKG: normal EKG, normal sinus rhythm, there are no previous tracings available for comparison.  ------------------------------------------------------------------------------------------------------------------------------------------------------------------------------------------------------------------- Assessment and Plan  Elevated blood pressure reading BP elevated at home at  times.  Normal reading here in clinic.  I asked him to record home readings for the next couple of weeks.  Return in 2 weeks for nurse visit and bring home cuff for comparison.  Checking CMP, CBC and TSH today.  He has had some intermittent dyspnea.  EKG is normal.  Discussed coronary calcium scoring.  He will consider this.    No orders of the defined types were placed in this encounter.   Return in about 2 weeks (around 06/20/2023) for nurse visit for BP check, compare to home cuff.    This visit occurred during the SARS-CoV-2 public health emergency.  Safety protocols were in place, including screening questions prior to the visit, additional usage of staff PPE, and extensive cleaning of exam room while observing appropriate contact time as indicated for disinfecting solutions.

## 2023-06-07 LAB — CMP14+EGFR
ALT: 17 IU/L (ref 0–44)
AST: 18 IU/L (ref 0–40)
Albumin: 4.3 g/dL (ref 4.1–5.1)
Alkaline Phosphatase: 63 IU/L (ref 44–121)
BUN/Creatinine Ratio: 17 (ref 9–20)
BUN: 17 mg/dL (ref 6–24)
Bilirubin Total: 0.4 mg/dL (ref 0.0–1.2)
CO2: 23 mmol/L (ref 20–29)
Calcium: 9.2 mg/dL (ref 8.7–10.2)
Chloride: 103 mmol/L (ref 96–106)
Creatinine, Ser: 1 mg/dL (ref 0.76–1.27)
Globulin, Total: 2.6 g/dL (ref 1.5–4.5)
Glucose: 80 mg/dL (ref 70–99)
Potassium: 4.3 mmol/L (ref 3.5–5.2)
Sodium: 139 mmol/L (ref 134–144)
Total Protein: 6.9 g/dL (ref 6.0–8.5)
eGFR: 92 mL/min/{1.73_m2} (ref >59)

## 2023-06-07 LAB — CBC WITH DIFFERENTIAL/PLATELET
Basophils Absolute: 0.1 10*3/uL (ref 0.0–0.2)
Basos: 1 %
EOS (ABSOLUTE): 0.2 10*3/uL (ref 0.0–0.4)
Eos: 3 %
Hematocrit: 43.7 % (ref 37.5–51.0)
Hemoglobin: 14.9 g/dL (ref 13.0–17.7)
Immature Grans (Abs): 0 10*3/uL (ref 0.0–0.1)
Immature Granulocytes: 1 %
Lymphocytes Absolute: 2.4 10*3/uL (ref 0.7–3.1)
Lymphs: 33 %
MCH: 31 pg (ref 26.6–33.0)
MCHC: 34.1 g/dL (ref 31.5–35.7)
MCV: 91 fL (ref 79–97)
Monocytes Absolute: 0.8 10*3/uL (ref 0.1–0.9)
Monocytes: 11 %
Neutrophils Absolute: 3.8 10*3/uL (ref 1.4–7.0)
Neutrophils: 51 %
Platelets: 276 10*3/uL (ref 150–450)
RBC: 4.8 x10E6/uL (ref 4.14–5.80)
RDW: 12.4 % (ref 11.6–15.4)
WBC: 7.4 10*3/uL (ref 3.4–10.8)

## 2023-06-07 LAB — TSH: TSH: 2.68 u[IU]/mL (ref 0.450–4.500)

## 2023-06-16 ENCOUNTER — Encounter: Payer: Self-pay | Admitting: Family Medicine

## 2023-06-16 ENCOUNTER — Other Ambulatory Visit: Payer: Self-pay | Admitting: Family Medicine

## 2023-06-16 DIAGNOSIS — N529 Male erectile dysfunction, unspecified: Secondary | ICD-10-CM

## 2023-06-20 ENCOUNTER — Ambulatory Visit (INDEPENDENT_AMBULATORY_CARE_PROVIDER_SITE_OTHER)

## 2023-06-20 VITALS — BP 108/72 | HR 69 | Ht 66.93 in

## 2023-06-20 DIAGNOSIS — R030 Elevated blood-pressure reading, without diagnosis of hypertension: Secondary | ICD-10-CM | POA: Diagnosis not present

## 2023-06-20 NOTE — Patient Instructions (Signed)
 Return as needed

## 2023-06-20 NOTE — Progress Notes (Signed)
   Established Patient Office Visit  Subjective   Patient ID: Matthew Marks, male    DOB: 06-05-1975  Age: 48 y.o. MRN: [id number]  Chief Complaint  Patient presents with   elevated blood pressure reading     BP check nurse visit.     HPI  Elevated BP reading at home and normal BP readings when in office- BP check nurse visit.  Home BP log = 135/83, 132/90, 131/91, 134/90, 141/97, 144/84, 145/85, 147/93, 134/76, 139/85.  Patient denies chest pain, shortness of breath, palpitations, dizziness or headaches.  Patient does states he is taking 5mg  creatine daily .   ROS    Objective:     BP 108/72 Comment: his cuff = 130/74  Pulse 69   Ht 5' 6.93" (1.7 m)   SpO2 99%   BMI 27.94 kg/m    Physical Exam   No results found for any visits on 06/20/23.    The 10-year ASCVD risk score (Arnett DK, et al., 2019) is: 2.1%    Assessment & Plan:  BP reading- initial reading= 122/78 ( home cuff reading = 142/86) second reading = 108/72 ( his cuff reading = 130/74). Per Dr. [name] patient BP cuff is inaccurate recommend getting new BP cuff. May continue to check BP with new BP cuff.  Problem List Items Addressed This Visit       Other   Elevated blood pressure reading - Primary    Return for Return as needed. Dickie Found, LPN

## 2023-06-21 NOTE — Addendum Note (Signed)
 Addended by: Doretha Ganja on: 06/21/2023 03:12 PM   Modules accepted: Orders

## 2023-06-22 ENCOUNTER — Telehealth: Payer: Self-pay | Admitting: Family Medicine

## 2023-06-22 DIAGNOSIS — R030 Elevated blood-pressure reading, without diagnosis of hypertension: Secondary | ICD-10-CM

## 2023-06-22 DIAGNOSIS — E785 Hyperlipidemia, unspecified: Secondary | ICD-10-CM

## 2023-06-22 NOTE — Telephone Encounter (Signed)
 Pt would like a CT order. States he understand that he has to pay out of pocket Copied from CRM [id number]. Topic: Referral - Question >> Jun 22, 2023 10:56 AM Matthew Marks wrote: Reason for CRM: Patient is needing to speak with someone CardioCt he is needing to know could someone please let him know if he needs to come in for a visit with his provider or can he just call the facility and make an apt for the test to be done. [phone]

## 2023-06-30 ENCOUNTER — Ambulatory Visit (INDEPENDENT_AMBULATORY_CARE_PROVIDER_SITE_OTHER): Payer: Self-pay

## 2023-06-30 DIAGNOSIS — R030 Elevated blood-pressure reading, without diagnosis of hypertension: Secondary | ICD-10-CM

## 2023-06-30 DIAGNOSIS — E785 Hyperlipidemia, unspecified: Secondary | ICD-10-CM

## 2023-07-05 ENCOUNTER — Encounter: Payer: Self-pay | Admitting: Family Medicine

## 2023-07-18 DIAGNOSIS — T63461A Toxic effect of venom of wasps, accidental (unintentional), initial encounter: Secondary | ICD-10-CM | POA: Diagnosis not present

## 2023-07-18 DIAGNOSIS — T63451A Toxic effect of venom of hornets, accidental (unintentional), initial encounter: Secondary | ICD-10-CM | POA: Diagnosis not present

## 2023-07-28 ENCOUNTER — Encounter: Payer: Self-pay | Admitting: Family Medicine

## 2023-07-28 ENCOUNTER — Ambulatory Visit: Admitting: Family Medicine

## 2023-07-28 ENCOUNTER — Ambulatory Visit (INDEPENDENT_AMBULATORY_CARE_PROVIDER_SITE_OTHER)

## 2023-07-28 VITALS — BP 122/77 | HR 72 | Ht 66.93 in | Wt 174.0 lb

## 2023-07-28 DIAGNOSIS — R0781 Pleurodynia: Secondary | ICD-10-CM

## 2023-07-28 DIAGNOSIS — T63451D Toxic effect of venom of hornets, accidental (unintentional), subsequent encounter: Secondary | ICD-10-CM | POA: Diagnosis not present

## 2023-07-28 DIAGNOSIS — R1011 Right upper quadrant pain: Secondary | ICD-10-CM

## 2023-07-28 DIAGNOSIS — T63461D Toxic effect of venom of wasps, accidental (unintentional), subsequent encounter: Secondary | ICD-10-CM | POA: Diagnosis not present

## 2023-07-28 NOTE — Progress Notes (Signed)
 Matthew Marks - 48 y.o. male MRN [id number]  Date of birth: 08-Oct-1975  Subjective Chief Complaint  Patient presents with   Abdominal Pain    HPI Matthew Marks is a 48 year old male here today with complaint of pain in the lower Right rib area.  This started approximately 3-4 weeks ago.  He does not recall any injury to this area.  He denies dyspnea associated with this.  Really has not affected his breathing at all.  Denies nausea or vomiting.  No urinary symptoms.  ROS:  A comprehensive ROS was completed and negative except as noted per HPI  Allergies  Allergen Reactions   Bee Venom Anaphylaxis    Past Medical History:  Diagnosis Date   H/O chronic ear infection 07/04/2014    Past Surgical History:  Procedure Laterality Date   BACK SURGERY     left knee surgery ligament tension     TYMPANOSTOMY TUBE PLACEMENT     as a child and as an adult    Social History   Socioeconomic History   Marital status: Married    Spouse name: Not on file   Number of children: Not on file   Years of education: Not on file   Highest education level: Not on file  Occupational History   Not on file  Tobacco Use   Smoking status: Never   Smokeless tobacco: Never  Vaping Use   Vaping status: Never Used  Substance and Sexual Activity   Alcohol use: Yes    Alcohol/week: 12.0 standard drinks of alcohol    Types: 12 Standard drinks or equivalent per week   Drug use: No   Sexual activity: Yes    Partners: Female  Other Topics Concern   Not on file  Social History Narrative   Not on file   Social Drivers of Health   Financial Resource Strain: Not on file  Food Insecurity: Not on file  Transportation Needs: Not on file  Physical Activity: Not on file  Stress: Not on file  Social Connections: Unknown (07/19/2021)   Received from [facility], [facility]   Social Network    Social Network: Not on file    Family History  Problem Relation Age of Onset   Prostate cancer Father     Hypertension Father    Diabetes Mother     Health Maintenance  Topic Date Due   Hepatitis C Screening  Never done   Colonoscopy  Never done   COVID-19 Vaccine (3 - 2024-25 season) 11/06/2022   Zoster Vaccines- Shingrix (1 of 2) Never done   INFLUENZA VACCINE  10/06/2023   DTaP/Tdap/Td (3 - Td or Tdap) 07/21/2031   HIV Screening  Completed   HPV VACCINES  Aged Out   Meningococcal B Vaccine  Aged Out     ----------------------------------------------------------------------------------------------------------------------------------------------------------------------------------------------------------------- Physical Exam BP 122/77 (BP Location: Left Arm, Patient Position: Sitting, Cuff Size: Normal)   Pulse 72   Ht 5' 6.93" (1.7 m)   Wt 174 lb (78.9 kg)   SpO2 97%   BMI 27.31 kg/m   Physical Exam Eyes:     General: No scleral icterus. Cardiovascular:     Rate and Rhythm: Normal rate and regular rhythm.  Pulmonary:     Effort: Pulmonary effort is normal.     Breath sounds: Normal breath sounds.     Comments: He does have tenderness to palpation along the lower ribs at the costochondral junction on the right. Abdominal:     General: Abdomen is  flat. Bowel sounds are normal.     Palpations: Abdomen is soft.  Psychiatric:        Mood and Affect: Mood normal.        Behavior: Behavior normal.     ------------------------------------------------------------------------------------------------------------------------------------------------------------------------------------------------------------------- Assessment and Plan  Rib pain Seems to be more of a costochondritis versus intercostal spasm.  Checking CMP and vitamin D  levels.  X-rays ordered with rib detail.    No orders of the defined types were placed in this encounter.   No follow-ups on file.

## 2023-07-28 NOTE — Addendum Note (Signed)
 Addended by: Lemya Greenwell E on: 07/28/2023 11:34 AM   Modules accepted: Orders

## 2023-07-28 NOTE — Assessment & Plan Note (Addendum)
 Seems to be more of a costochondritis versus intercostal spasm.  Checking CMP and vitamin D  levels.  X-rays ordered with rib detail.

## 2023-07-29 LAB — CMP14+EGFR
ALT: 20 IU/L (ref 0–44)
AST: 16 IU/L (ref 0–40)
Albumin: 4.6 g/dL (ref 4.1–5.1)
Alkaline Phosphatase: 65 IU/L (ref 44–121)
BUN/Creatinine Ratio: 11 (ref 9–20)
BUN: 12 mg/dL (ref 6–24)
Bilirubin Total: 0.5 mg/dL (ref 0.0–1.2)
CO2: 22 mmol/L (ref 20–29)
Calcium: 9.4 mg/dL (ref 8.7–10.2)
Chloride: 102 mmol/L (ref 96–106)
Creatinine, Ser: 1.11 mg/dL (ref 0.76–1.27)
Globulin, Total: 2.7 g/dL (ref 1.5–4.5)
Glucose: 71 mg/dL (ref 70–99)
Potassium: 5.1 mmol/L (ref 3.5–5.2)
Sodium: 139 mmol/L (ref 134–144)
Total Protein: 7.3 g/dL (ref 6.0–8.5)
eGFR: 81 mL/min/{1.73_m2} (ref >59)

## 2023-07-29 LAB — VITAMIN D 25 HYDROXY (VIT D DEFICIENCY, FRACTURES): Vit D, 25-Hydroxy: 40.1 ng/mL (ref 30.0–100.0)

## 2023-08-02 ENCOUNTER — Ambulatory Visit: Payer: Self-pay | Admitting: Family Medicine

## 2023-08-02 ENCOUNTER — Encounter: Payer: Self-pay | Admitting: Family Medicine

## 2023-08-02 NOTE — Telephone Encounter (Signed)
 Spoke with radiology reading room - they will expedite the x-ray for reading

## 2023-08-02 NOTE — Telephone Encounter (Signed)
 Attempted call to radiology reading room - no answer- left a voice mail requesting a call back.

## 2023-08-03 ENCOUNTER — Encounter: Payer: Self-pay | Admitting: Family Medicine

## 2023-08-07 ENCOUNTER — Telehealth: Payer: Self-pay | Admitting: Family Medicine

## 2023-08-07 ENCOUNTER — Ambulatory Visit: Payer: Self-pay

## 2023-08-07 DIAGNOSIS — R1011 Right upper quadrant pain: Secondary | ICD-10-CM

## 2023-08-07 NOTE — Telephone Encounter (Signed)
 Patient called he is requesting to see you and would like to get an ultasound right flank pain Stated Dr. [name] brushed him off please advise

## 2023-08-07 NOTE — Telephone Encounter (Signed)
 Abdominal ultrasound ordered, please let him know that interpreting the results of an abdominal ultrasound is difficult without me getting an actual history and physical.

## 2023-08-07 NOTE — Telephone Encounter (Signed)
 Routing to the covering provider. Patient was advised of his lab results. He is concerned that his EGFR levels are low. Provider's msg was provided to the patient verbatim. Patient is still having right flank pain. He stated that he and Dr. [name] spoke briefly about doing a CT.

## 2023-08-07 NOTE — Telephone Encounter (Signed)
 Seen by patient Matthew Marks on 08/07/2023  1:03 PM

## 2023-08-07 NOTE — Telephone Encounter (Signed)
 Copied from CRM [id number]. Topic: Clinical - Lab/Test Results >> Aug 07, 2023  8:18 AM Tisa Forester wrote: Reason for CRM: Patient request want to go over his last 2 labs results  had 2 in last 30 days , mention the EGFR is a little lower than last time Patient callback number [phone] (M)  Answer Assessment - Initial Assessment Questions 1. REASON FOR CALL or QUESTION: "What is your reason for calling today?" or "How can I best help you?" or "What question do you have that I can help answer?"     Pt called concerned about lab results: stated he has been having right flank pain and the PCP wanted to check lab and after the results PCP would put together a plan of care to determine next steps such as u/s, more labs, scans, etc.  Pt would like a call back to determine plan of care regarding this pain and also would like to know why is his GFR been dropping over last several months.  Protocols used: Information Only Call - No Triage-A-AH

## 2023-08-08 ENCOUNTER — Ambulatory Visit: Payer: Self-pay | Admitting: Sports Medicine

## 2023-08-08 ENCOUNTER — Ambulatory Visit

## 2023-08-08 DIAGNOSIS — R1011 Right upper quadrant pain: Secondary | ICD-10-CM | POA: Diagnosis not present

## 2023-08-08 DIAGNOSIS — B353 Tinea pedis: Secondary | ICD-10-CM | POA: Diagnosis not present

## 2023-08-08 DIAGNOSIS — Z129 Encounter for screening for malignant neoplasm, site unspecified: Secondary | ICD-10-CM | POA: Diagnosis not present

## 2023-08-08 DIAGNOSIS — B351 Tinea unguium: Secondary | ICD-10-CM | POA: Diagnosis not present

## 2023-08-08 NOTE — Telephone Encounter (Signed)
 Spoke with patient .  He had ultrasound ordered by Dr. Elva Hamburger today.  Patient scheduled appt for June 18th with Dr. [name]

## 2023-08-09 NOTE — Telephone Encounter (Signed)
 Left a vm msg for the patient regarding the covering provider's note. Direct call back info provided.

## 2023-08-21 DIAGNOSIS — T63451A Toxic effect of venom of hornets, accidental (unintentional), initial encounter: Secondary | ICD-10-CM | POA: Diagnosis not present

## 2023-08-21 DIAGNOSIS — T63461A Toxic effect of venom of wasps, accidental (unintentional), initial encounter: Secondary | ICD-10-CM | POA: Diagnosis not present

## 2023-08-23 ENCOUNTER — Ambulatory Visit: Admitting: Family Medicine

## 2023-10-02 DIAGNOSIS — T63451A Toxic effect of venom of hornets, accidental (unintentional), initial encounter: Secondary | ICD-10-CM | POA: Diagnosis not present

## 2023-10-02 DIAGNOSIS — T63461A Toxic effect of venom of wasps, accidental (unintentional), initial encounter: Secondary | ICD-10-CM | POA: Diagnosis not present

## 2023-11-07 ENCOUNTER — Encounter: Payer: Self-pay | Admitting: Sports Medicine

## 2023-11-13 DIAGNOSIS — T63461A Toxic effect of venom of wasps, accidental (unintentional), initial encounter: Secondary | ICD-10-CM | POA: Diagnosis not present

## 2023-11-13 DIAGNOSIS — T63451A Toxic effect of venom of hornets, accidental (unintentional), initial encounter: Secondary | ICD-10-CM | POA: Diagnosis not present

## 2023-11-30 ENCOUNTER — Other Ambulatory Visit: Payer: Self-pay | Admitting: Family Medicine

## 2023-11-30 DIAGNOSIS — N529 Male erectile dysfunction, unspecified: Secondary | ICD-10-CM

## 2023-12-18 ENCOUNTER — Ambulatory Visit: Admitting: Urgent Care

## 2023-12-18 ENCOUNTER — Ambulatory Visit: Payer: Self-pay

## 2023-12-18 ENCOUNTER — Encounter: Payer: Self-pay | Admitting: Urgent Care

## 2023-12-18 VITALS — BP 137/92 | HR 59 | Ht 66.0 in | Wt 180.0 lb

## 2023-12-18 DIAGNOSIS — H6993 Unspecified Eustachian tube disorder, bilateral: Secondary | ICD-10-CM

## 2023-12-18 DIAGNOSIS — H6590 Unspecified nonsuppurative otitis media, unspecified ear: Secondary | ICD-10-CM | POA: Diagnosis not present

## 2023-12-18 DIAGNOSIS — R42 Dizziness and giddiness: Secondary | ICD-10-CM

## 2023-12-18 DIAGNOSIS — H6992 Unspecified Eustachian tube disorder, left ear: Secondary | ICD-10-CM | POA: Diagnosis not present

## 2023-12-18 MED ORDER — FLUTICASONE PROPIONATE 50 MCG/ACT NA SUSP: 2.0000 | Freq: Every day | NASAL | 0 refills | Status: AC

## 2023-12-18 MED ORDER — MECLIZINE HCL 25 MG PO TABS: 25.0000 mg | ORAL_TABLET | Freq: Three times a day (TID) | ORAL | 0 refills | Status: AC | PRN

## 2023-12-18 MED ORDER — PREDNISONE 50 MG PO TABS: ORAL_TABLET | ORAL | 0 refills | Status: AC

## 2023-12-18 NOTE — Patient Instructions (Addendum)
 Please take meclizine every 8 hours as needed. This can help with dizziness. If you prefer, you can use your scopolamine patch that you have at home for 72 hours (but do not use both).  For the fluid in your ears, please use flonase  twice daily each nostril. Take prednisone  once daily with breakfast until completed.  If you continue to have symptoms despite above intervention, please notify our office.

## 2023-12-18 NOTE — Telephone Encounter (Signed)
 Patient reports getting home from a cruise yesterday. Patient reports dizziness since Wednesday. No nausea, vomiting, chest pain or shortness of breath. Moderate dizziness per patient. Patient scheduled for an acute visit this morning at 9:40 AM.  FYI Only or Action Required?: FYI only for provider.  Patient was last seen in primary care on 07/28/2023 by Alvia Bring, DO.  Called Nurse Triage reporting Dizziness.  Symptoms began several days ago.  Interventions attempted: Rest, hydration, or home remedies.  Symptoms are: unchanged.  Triage Disposition: See Physician Within 24 Hours  Patient/caregiver understands and will follow disposition?: Yes  Copied from CRM #[id number]. Topic: Clinical - Red Word Triage >> Dec 18, 2023  8:19 AM Delon HERO wrote: Red Word that prompted transfer to Nurse Triage:  Patient is calling to report dizziness since Wednesday. Patient got off a cruise yesterday. Patient does not get sick legs. Requesting appointment. Reason for Disposition  [1] MODERATE dizziness (e.g., interferes with normal activities) AND [2] has NOT been evaluated by doctor (or NP/PA) for this  (Exception: Dizziness caused by heat exposure, sudden standing, or poor fluid intake.)  Answer Assessment - Initial Assessment Questions 1. DESCRIPTION: Describe your dizziness.     Patient reports he is having dizziness since Wednesday. Patient reports he got off of a cruise yesterday.  2. LIGHTHEADED: Do you feel lightheaded? (e.g., somewhat faint, woozy, weak upon standing)     yes 3. VERTIGO: Do you feel like either you or the room is spinning or tilting? (i.e., vertigo)     yes 4. SEVERITY: How bad is it?  Do you feel like you are going to faint? Can you stand and walk?     Patient feels okay when he is stationary but states that when he gets up and walks feels very spinny Moderate dizziness per patient 5. ONSET:  When did the dizziness begin?     Started Wednesday.  6.  AGGRAVATING FACTORS: Does anything make it worse? (e.g., standing, change in head position)     Any movement 7. HEART RATE: Can you tell me your heart rate? How many beats in 15 seconds?  (Note: Not all patients can do this.)       NA 8. CAUSE: What do you think is causing the dizziness? (e.g., decreased fluids or food, diarrhea, emotional distress, heat exposure, new medicine, sudden standing, vomiting; unknown)     unsure 9. RECURRENT SYMPTOM: Have you had dizziness before? If Yes, ask: When was the last time? What happened that time?     no 10. OTHER SYMPTOMS: Do you have any other symptoms? (e.g., fever, chest pain, vomiting, diarrhea, bleeding)       no  Protocols used: Dizziness - Lightheadedness-A-AH

## 2023-12-18 NOTE — Progress Notes (Signed)
 Established Patient Office Visit  Subjective:  Patient ID: Matthew Marks, male    DOB: 09/14/1975  Age: 48 y.o. MRN: [id number]  Chief Complaint  Patient presents with   Dizziness    HPI  Discussed the use of AI scribe software for clinical note transcription with the patient, who gave verbal consent to proceed.  History of Present Illness   Matthew Marks is a 48 year old male who presents with dizziness and balance issues.  He has been experiencing dizziness and balance issues since last Tuesday night while on a cruise ship. The sensation is described as 'going wall to wall' and staggering, with difficulty focusing and a feeling of spinning. These symptoms have persisted without improvement, even after disembarking from the ship yesterday.  He has been on cruises before without similar symptoms. Initially, he considered motion sickness due to rough seas, but the symptoms did not resolve when the seas calmed. He has a history of chronic ear infections, which have previously caused him to feel 'woozy,' but he denies any current cold symptoms, ear pressure, or runny nose.  Movements such as bending over, looking left or right, and reaching over his shoes exacerbate his dizziness. No nausea, headache, fever, weakness, numbness, tingling, or changes in comprehension or speech. He mentions a sensation of blurriness and fogginess in his vision, although he does not wear glasses regularly.  He took Beano for seasickness during the cruise, which provided minimal relief. He also used a homeopathic nasal spray to minimize germs. He has not experienced similar dizziness before and is concerned about his ability to return to work due to these symptoms.  His blood pressure was noted to be moderately elevated today, which he attributes to recent cruise activities. He has not been monitoring his blood pressure regularly since discarding a potentially faulty home machine. He typically has better-controlled  blood pressure, with previous readings around 120 mmHg.       Patient Active Problem List   Diagnosis Date Noted   Rib pain 07/28/2023   Elevated blood pressure reading 06/06/2023   S/P arthroscopy of left shoulder 06/30/2022   Dysfunction of left eustachian tube 03/25/2022   Degenerative superior labral anterior-to-posterior (SLAP) tear of left shoulder 03/10/2022   Erectile dysfunction 01/13/2022   Thrombosed external hemorrhoid 01/02/2020   Post-viral cough syndrome 10/10/2019   Insect sting 08/30/2019   Biceps tendinitis, right, distal 06/25/2019   Well adult exam 05/01/2019   Elevated fasting glucose 05/01/2017   Tinea pedis of both feet 06/07/2016   MDD (major depressive disorder), recurrent episode, moderate (HCC) 05/25/2016   Chronic alcohol use 05/25/2016   GAD (generalized anxiety disorder) 04/19/2016   Poor concentration 10/13/2015   Globus sensation 09/04/2014   DDD (degenerative disc disease), lumbar 07/10/2014   H/O chronic ear infection 07/04/2014   Family history of prostate cancer 07/04/2014   Bilateral plantar fasciitis 07/04/2014   Presence of tympanostomy tube in right tympanic membrane 07/04/2014   Past Medical History:  Diagnosis Date   H/O chronic ear infection 07/04/2014   Past Surgical History:  Procedure Laterality Date   BACK SURGERY     left knee surgery ligament tension     TYMPANOSTOMY TUBE PLACEMENT     as a child and as an adult   Social History   Tobacco Use   Smoking status: Never   Smokeless tobacco: Never  Vaping Use   Vaping status: Never Used  Substance Use Topics   Alcohol use: Yes  Alcohol/week: 12.0 standard drinks of alcohol    Types: 12 Standard drinks or equivalent per week   Drug use: No      ROS: as noted in HPI  Objective:     BP (!) 137/92   Pulse (!) 59   Ht 5' 6 (1.676 m)   Wt 180 lb (81.6 kg)   SpO2 97%   BMI 29.05 kg/m  BP Readings from Last 3 Encounters:  12/18/23 (!) 137/92  07/28/23 122/77   06/20/23 108/72   Wt Readings from Last 3 Encounters:  12/18/23 180 lb (81.6 kg)  07/28/23 174 lb (78.9 kg)  06/06/23 178 lb (80.7 kg)      Physical Exam Constitutional:      General: He is not in acute distress.    Appearance: Normal appearance. He is not ill-appearing or toxic-appearing.  HENT:     Head: Normocephalic and atraumatic.     Right Ear: External ear normal. A middle ear effusion is present. Tympanic membrane is scarred. Tympanic membrane is not injected, perforated or erythematous.     Left Ear: External ear normal. A middle ear effusion is present. Tympanic membrane is not injected, scarred, perforated or erythematous.     Nose: Nose normal. No congestion or rhinorrhea.     Right Turbinates: Not enlarged.     Left Turbinates: Not enlarged.     Mouth/Throat:     Lips: Pink.     Mouth: Mucous membranes are moist.     Pharynx: Oropharynx is clear. Uvula midline. No pharyngeal swelling or oropharyngeal exudate.  Eyes:     General: No visual field deficit. Cardiovascular:     Rate and Rhythm: Normal rate and regular rhythm.     Heart sounds: No murmur heard. Pulmonary:     Effort: Pulmonary effort is normal. No respiratory distress.     Breath sounds: Normal breath sounds. No stridor. No wheezing or rhonchi.  Musculoskeletal:     Cervical back: Normal range of motion and neck supple. No rigidity or tenderness.  Lymphadenopathy:     Cervical: No cervical adenopathy.  Skin:    General: Skin is warm and dry.     Coloration: Skin is not jaundiced.     Findings: No bruising, erythema or rash.  Neurological:     General: No focal deficit present.     Mental Status: He is alert and oriented to person, place, and time. Mental status is at baseline.     Cranial Nerves: Cranial nerves 2-12 are intact. No cranial nerve deficit, dysarthria or facial asymmetry.     Sensory: Sensation is intact. No sensory deficit.     Motor: Motor function is intact. No weakness, atrophy,  abnormal muscle tone or seizure activity.     Coordination: Coordination is intact. Coordination normal. Finger-Nose-Finger Test normal.     Gait: Gait is intact. Gait normal.      No results found for any visits on 12/18/23.  Last CBC Lab Results  Component Value Date   WBC 7.4 06/06/2023   HGB 14.9 06/06/2023   HCT 43.7 06/06/2023   MCV 91 06/06/2023   MCH 31.0 06/06/2023   RDW 12.4 06/06/2023   PLT 276 06/06/2023   Last metabolic panel Lab Results  Component Value Date   GLUCOSE 71 07/28/2023   NA 139 07/28/2023   K 5.1 07/28/2023   CL 102 07/28/2023   CO2 22 07/28/2023   BUN 12 07/28/2023   CREATININE 1.11 07/28/2023   EGFR 81  07/28/2023   CALCIUM 9.4 07/28/2023   PROT 7.3 07/28/2023   ALBUMIN 4.6 07/28/2023   LABGLOB 2.7 07/28/2023   BILITOT 0.5 07/28/2023   ALKPHOS 65 07/28/2023   AST 16 07/28/2023   ALT 20 07/28/2023   Last lipids Lab Results  Component Value Date   CHOL 183 01/10/2023   HDL 55 01/10/2023   LDLCALC 110 (H) 01/10/2023   TRIG 100 01/10/2023   CHOLHDL 3.4 07/01/2021   Last hemoglobin A1c Lab Results  Component Value Date   HGBA1C 5.3 07/01/2021   Last thyroid  functions Lab Results  Component Value Date   TSH 2.680 06/06/2023   Last vitamin D  Lab Results  Component Value Date   VD25OH 40.1 07/28/2023   Last vitamin B12 and Folate No results found for: VITAMINB12, FOLATE    The 10-year ASCVD risk score (Arnett DK, et al., 2019) is: 3.1%  Assessment & Plan:  Eustachian tube dysfunction, bilateral -     Fluticasone  Propionate; Place 2 sprays into both nostrils daily.  Dispense: 16 g; Refill: 0 -     predniSONE ; One tab PO daily for 5 days.  Dispense: 5 tablet; Refill: 0  Fluid collection of middle ear -     Fluticasone  Propionate; Place 2 sprays into both nostrils daily.  Dispense: 16 g; Refill: 0 -     predniSONE ; One tab PO daily for 5 days.  Dispense: 5 tablet; Refill: 0  Dizziness -     Meclizine HCl; Take 1  tablet (25 mg total) by mouth 3 (three) times daily as needed for dizziness or nausea.  Dispense: 30 tablet; Refill: 0  Dysfunction of left eustachian tube  Assessment and Plan    Dizziness and imbalance Normal neurological exam suggests equilibrium changes from the cruise or fluid in the ears. - Prescribed oral meclizine for dizziness, to be taken as needed every 8 hours. - Discussed option of using transdermal scopolamine patch if available at home, but not to be used concurrently with meclizine. - Advised on hydration to mitigate dehydration risk associated with scopolamine. - Provided doctor's note for work, allowing return on 12/21/2023, with option to return sooner if symptoms improve.  Eustachian tube dysfunction with serous otitis media Serous effusion with clear fluid behind both eardrums indicates eustachian tube dysfunction. No signs of infection, so antibiotics are not indicated. - Prescribed Flonase  or similar steroid nasal spray to address eustachian tube dysfunction. - Prescribed low-dose prednisone  to reduce inflammation and facilitate fluid drainage. - Advised to take prednisone  with food and before noon to avoid insomnia.  Elevated blood pressure Blood pressure moderately elevated at 137 mmHg systolic, higher than previous readings. Recent cruise with dietary and lifestyle changes may have contributed to elevation. - Recheck blood pressure after cruise-related dietary changes have resolved.         No follow-ups on file.   Benton LITTIE Gave, PA

## 2023-12-21 ENCOUNTER — Ambulatory Visit: Admitting: Urgent Care

## 2023-12-21 ENCOUNTER — Ambulatory Visit: Payer: Self-pay

## 2023-12-21 VITALS — BP 120/82 | HR 71

## 2023-12-21 DIAGNOSIS — R42 Dizziness and giddiness: Secondary | ICD-10-CM

## 2023-12-21 DIAGNOSIS — R0989 Other specified symptoms and signs involving the circulatory and respiratory systems: Secondary | ICD-10-CM | POA: Diagnosis not present

## 2023-12-21 MED ORDER — DIAZEPAM 2 MG PO TABS: 2.0000 mg | ORAL_TABLET | Freq: Four times a day (QID) | ORAL | 0 refills | Status: AC | PRN

## 2023-12-21 NOTE — Progress Notes (Signed)
 Established Patient Office Visit  Subjective:  Patient ID: Matthew Marks, male    DOB: Feb 20, 1976  Age: 48 y.o. MRN: [id number]  Chief Complaint  Patient presents with   Dizziness    From sitting to stand that last for 45 minutes    HPI  Discussed the use of AI scribe software for clinical note transcription with the patient, who gave verbal consent to proceed.  History of Present Illness   Matthew Marks is a 48 year old male who presents with dizziness and head rushes upon standing.  He experiences dizziness and head rushes primarily when transitioning from a resting position to standing. These symptoms have persisted over the past two days without improvement. The dizziness can last up to an hour and is alleviated when sitting or lying down. He describes the sensation as a 'head rush' and mentions that it is aggravated by standing, movement, looking around, and leaning over.  He has been taking prednisone , with his last dose scheduled for tomorrow, and has noticed only minor improvements. He also uses meclizine twice daily, which has not fully alleviated the dizziness when standing. He mentions experiencing crackling and popping in his ears two days ago, which he initially thought might indicate improvement, but these symptoms have since subsided. He attempts to relieve ear pressure by blowing his nose.  No palpitations, chest pain, persistent nausea, vomiting, diarrhea, or abdominal symptoms. He reports adequate hydration and no significant electrolyte loss. He does not smoke, but occasionally uses cigars.  During a previous visit in the spring, he underwent a CT cardiac scoring due to fluctuating blood pressure readings, which were attributed to a potentially uncalibrated home monitor. He recalls a significant difference in blood pressure readings between his arms during a previous visit.  His current medications include prednisone , meclizine, and over-the-counter supplements such as CBD,  quercetin, and melatonin. He has not taken creatine powder in the past two to three weeks.  He reports a 'haze' in vision associated with dizziness but denies headaches, although he describes a 'pulsing' sensation when standing. He also notes some neck stiffness.      Patient Active Problem List   Diagnosis Date Noted   Rib pain 07/28/2023   Elevated blood pressure reading 06/06/2023   S/P arthroscopy of left shoulder 06/30/2022   Dysfunction of left eustachian tube 03/25/2022   Degenerative superior labral anterior-to-posterior (SLAP) tear of left shoulder 03/10/2022   Erectile dysfunction 01/13/2022   Thrombosed external hemorrhoid 01/02/2020   Post-viral cough syndrome 10/10/2019   Insect sting 08/30/2019   Biceps tendinitis, right, distal 06/25/2019   Well adult exam 05/01/2019   Elevated fasting glucose 05/01/2017   Tinea pedis of both feet 06/07/2016   MDD (major depressive disorder), recurrent episode, moderate (HCC) 05/25/2016   Chronic alcohol use 05/25/2016   GAD (generalized anxiety disorder) 04/19/2016   Poor concentration 10/13/2015   Globus sensation 09/04/2014   DDD (degenerative disc disease), lumbar 07/10/2014   H/O chronic ear infection 07/04/2014   Family history of prostate cancer 07/04/2014   Bilateral plantar fasciitis 07/04/2014   Presence of tympanostomy tube in right tympanic membrane 07/04/2014   Past Medical History:  Diagnosis Date   H/O chronic ear infection 07/04/2014   Past Surgical History:  Procedure Laterality Date   BACK SURGERY     left knee surgery ligament tension     TYMPANOSTOMY TUBE PLACEMENT     as a child and as an adult   Social History   Tobacco Use  Smoking status: Never   Smokeless tobacco: Never  Vaping Use   Vaping status: Never Used  Substance Use Topics   Alcohol use: Yes    Alcohol/week: 12.0 standard drinks of alcohol    Types: 12 Standard drinks or equivalent per week   Drug use: No      ROS: as noted in  HPI  Objective:     BP 120/82 (BP Location: Right Arm)   Pulse 71   SpO2 97%  BP Readings from Last 3 Encounters:  12/21/23 120/82  12/18/23 (!) 137/92  07/28/23 122/77   Wt Readings from Last 3 Encounters:  12/18/23 180 lb (81.6 kg)  07/28/23 174 lb (78.9 kg)  06/06/23 178 lb (80.7 kg)   Orthostatic vitals 153/89 150/94 149/94 120/82   BP Location -- -- -- Right Arm  Patient Position Supine Sitting Standing --  Pulse Rate 67 66 71      Physical Exam Constitutional:      General: He is not in acute distress.    Appearance: Normal appearance. He is not ill-appearing or toxic-appearing.  HENT:     Head: Normocephalic and atraumatic.     Right Ear: External ear normal. A middle ear effusion is present. Tympanic membrane is scarred. Tympanic membrane is not injected, perforated or erythematous.     Left Ear: External ear normal. A middle ear effusion is present. Tympanic membrane is not injected, scarred, perforated or erythematous.     Nose: Nose normal. No congestion or rhinorrhea.     Right Turbinates: Not enlarged.     Left Turbinates: Not enlarged.     Mouth/Throat:     Lips: Pink.     Mouth: Mucous membranes are moist.     Pharynx: Oropharynx is clear. Uvula midline. No pharyngeal swelling or oropharyngeal exudate.  Eyes:     General: No visual field deficit or scleral icterus.       Right eye: No discharge.        Left eye: No discharge.     Extraocular Movements: Extraocular movements intact.     Pupils: Pupils are equal, round, and reactive to light.  Neck:     Vascular: No carotid bruit.  Cardiovascular:     Rate and Rhythm: Normal rate and regular rhythm.     Heart sounds: No murmur heard. Pulmonary:     Effort: Pulmonary effort is normal. No respiratory distress.     Breath sounds: Normal breath sounds. No stridor. No wheezing or rhonchi.  Musculoskeletal:        General: Normal range of motion.     Cervical back: Normal range of motion and neck  supple. No rigidity or tenderness.     Right lower leg: No edema.     Left lower leg: No edema.  Lymphadenopathy:     Cervical: No cervical adenopathy.  Skin:    General: Skin is warm and dry.     Coloration: Skin is not jaundiced.     Findings: No bruising, erythema or rash.  Neurological:     General: No focal deficit present.     Mental Status: He is alert and oriented to person, place, and time. Mental status is at baseline.     Cranial Nerves: Cranial nerves 2-12 are intact. No cranial nerve deficit, dysarthria or facial asymmetry.     Sensory: Sensation is intact. No sensory deficit.     Motor: Motor function is intact. No weakness, atrophy, abnormal muscle tone or seizure activity.  Coordination: Coordination is intact. Coordination normal. Finger-Nose-Finger Test normal.     Gait: Gait is intact. Gait normal.     Deep Tendon Reflexes: Reflexes are normal and symmetric. Reflexes normal.  Psychiatric:        Mood and Affect: Mood normal.        Behavior: Behavior normal.     The 10-year ASCVD risk score (Arnett DK, et al., 2019) is: 2.5%  Assessment & Plan:  Dizziness -     CMP14+EGFR -     CBC with Differential/Platelet -     TSH + free T4 -     Magnesium -     CT ANGIO NECK W OR WO CONTRAST; Future -     CT ANGIO CHEST AORTA W/CM & OR WO/CM; Future -     diazePAM; Take 1 tablet (2 mg total) by mouth every 6 (six) hours as needed for anxiety.  Dispense: 7 tablet; Refill: 0  Unequal blood pressure in upper extremities  Assessment and Plan    Dizziness and orthostatic symptoms Persistent dizziness and orthostatic symptoms unresponsive to prednisone  and meclizine. Labs today to assess electrolyte imbalance. - trial of 2mg  valium as needed for vertigo. - Order routine labs to rule out underlying causes. - orthostatics negative - neuro exam in office reassuring  Abnormal blood pressure difference between arms Significant inter-arm blood pressure difference.  -  Manually checked blood pressure in both arms with about difference betwee R/L. - CTA neck/ chest ordered if patient continues to have sx despite interventions above to r/o subclavian steel syndrome or other vascular anomaly  Eustachian tube dysfunction with tympanic membrane scarring Eustachian tube dysfunction with tympanic membrane scarring and occasional ear crackling. Tympanic membrane perforation closed.  - continue flonase  and finish prednisone         No follow-ups on file.   Benton LITTIE Gave, PA

## 2023-12-21 NOTE — Telephone Encounter (Signed)
 FYI Only or Action Required?: FYI only for provider.  Patient was last seen in primary care on 12/18/2023 by Lowella Benton CROME, PA.  Called Nurse Triage reporting Dizziness.  Symptoms began several weeks ago.  Interventions attempted: Prescription medications: meclizine; prednisone , flonase .  Symptoms are: unchanged.  Triage Disposition: See PCP When Office is Open (Within 3 Days)  Patient/caregiver understands and will follow disposition?: Yes  Copied from CRM #[id number]. Topic: Clinical - Red Word Triage >> Dec 21, 2023 10:06 AM Joesph NOVAK wrote: Red Word that prompted transfer to Nurse Triage:  Still having ear issues, dizziness. Came in Monday. Reason for Disposition  [1] MODERATE dizziness (e.g., interferes with normal activities) AND [2] has been evaluated by doctor (or NP/PA) for this  Answer Assessment - Initial Assessment Questions Pt calls to state he does not feel better despite interventions set by provider Monday oct 13. Taking prednisone , flonase , meclizine but has very minimal improvement. Just would like to follow up with provider especially because he is due to return to work tomorrow.   1. DESCRIPTION: Describe your dizziness.     Brain fog with dizziness jitteriness 2. LIGHTHEADED: Do you feel lightheaded? (e.g., somewhat faint, woozy, weak upon standing)     denies 3. VERTIGO: Do you feel like either you or the room is spinning or tilting? (i.e., vertigo)     denies 4. SEVERITY: How bad is it?  Do you feel like you are going to faint? Can you stand and walk?     States he needs to sit down  5. ONSET:  When did the dizziness begin?     1.5 weeks 6. AGGRAVATING FACTORS: Does anything make it worse? (e.g., standing, change in head position)     Sudden standing, bending over, moving head too fast 7. HEART RATE: Can you tell me your heart rate? How many beats in 15 seconds?  (Note: Not all patients can do this.)       denies 8. CAUSE: What do  you think is causing the dizziness? (e.g., decreased fluids or food, diarrhea, emotional distress, heat exposure, new medicine, sudden standing, vomiting; unknown)     Inner ears or something else 9. RECURRENT SYMPTOM: Have you had dizziness before? If Yes, ask: When was the last time? What happened that time?     denies 10. OTHER SYMPTOMS: Do you have any other symptoms? (e.g., fever, chest pain, vomiting, diarrhea, bleeding)       denies  Protocols used: Dizziness - Lightheadedness-A-AH

## 2023-12-21 NOTE — Patient Instructions (Addendum)
 We drew labs to further assess the cause of your symptoms. You can take 2mg  diazepam nightly before bed - this may help with your dizziness. It is sedating, so do not drive after taking.  You do have a very distinct difference in blood pressure between your arms. If your dizziness persists after 1-2 days of diazepam, I would recommend you get a CTA of your chest and neck to ensure there is not a vascular cause of this.

## 2023-12-21 NOTE — Telephone Encounter (Signed)
Pt coming in today for OV

## 2023-12-22 ENCOUNTER — Ambulatory Visit (HOSPITAL_BASED_OUTPATIENT_CLINIC_OR_DEPARTMENT_OTHER)

## 2023-12-22 ENCOUNTER — Encounter: Payer: Self-pay | Admitting: Urgent Care

## 2023-12-22 ENCOUNTER — Ambulatory Visit: Payer: Self-pay | Admitting: Urgent Care

## 2023-12-22 DIAGNOSIS — R42 Dizziness and giddiness: Secondary | ICD-10-CM

## 2023-12-22 LAB — CBC WITH DIFFERENTIAL/PLATELET
Basophils Absolute: 0 x10E3/uL (ref 0.0–0.2)
Basos: 0 %
EOS (ABSOLUTE): 0 x10E3/uL (ref 0.0–0.4)
Eos: 0 %
Hematocrit: 47.2 % (ref 37.5–51.0)
Hemoglobin: 15.9 g/dL (ref 13.0–17.7)
Immature Grans (Abs): 0.1 x10E3/uL (ref 0.0–0.1)
Immature Granulocytes: 1 %
Lymphocytes Absolute: 0.9 x10E3/uL (ref 0.7–3.1)
Lymphs: 10 %
MCH: 31.1 pg (ref 26.6–33.0)
MCHC: 33.7 g/dL (ref 31.5–35.7)
MCV: 92 fL (ref 79–97)
Monocytes Absolute: 0.2 x10E3/uL (ref 0.1–0.9)
Monocytes: 2 %
Neutrophils Absolute: 8.3 x10E3/uL — ABNORMAL HIGH (ref 1.4–7.0)
Neutrophils: 87 %
Platelets: 256 x10E3/uL (ref 150–450)
RBC: 5.12 x10E6/uL (ref 4.14–5.80)
RDW: 12.4 % (ref 11.6–15.4)
WBC: 9.5 x10E3/uL (ref 3.4–10.8)

## 2023-12-22 LAB — CMP14+EGFR
ALT: 32 IU/L (ref 0–44)
AST: 17 IU/L (ref 0–40)
Albumin: 4.5 g/dL (ref 4.1–5.1)
Alkaline Phosphatase: 62 IU/L (ref 47–123)
BUN/Creatinine Ratio: 21 — ABNORMAL HIGH (ref 9–20)
BUN: 21 mg/dL (ref 6–24)
Bilirubin Total: 0.4 mg/dL (ref 0.0–1.2)
CO2: 20 mmol/L (ref 20–29)
Calcium: 9.3 mg/dL (ref 8.7–10.2)
Chloride: 100 mmol/L (ref 96–106)
Creatinine, Ser: 1 mg/dL (ref 0.76–1.27)
Globulin, Total: 2.4 g/dL (ref 1.5–4.5)
Glucose: 148 mg/dL — ABNORMAL HIGH (ref 70–99)
Potassium: 4.5 mmol/L (ref 3.5–5.2)
Sodium: 138 mmol/L (ref 134–144)
Total Protein: 6.9 g/dL (ref 6.0–8.5)
eGFR: 92 mL/min/1.73 (ref >59)

## 2023-12-22 LAB — MAGNESIUM: Magnesium: 2.1 mg/dL (ref 1.6–2.3)

## 2023-12-22 LAB — TSH+FREE T4
Free T4: 1.26 ng/dL (ref 0.82–1.77)
TSH: 1.32 u[IU]/mL (ref 0.450–4.500)

## 2023-12-25 ENCOUNTER — Ambulatory Visit (HOSPITAL_BASED_OUTPATIENT_CLINIC_OR_DEPARTMENT_OTHER)
Admission: RE | Admit: 2023-12-25 | Discharge: 2023-12-25 | Disposition: A | Source: Ambulatory Visit | Attending: Urgent Care | Admitting: Urgent Care

## 2023-12-25 ENCOUNTER — Encounter (HOSPITAL_BASED_OUTPATIENT_CLINIC_OR_DEPARTMENT_OTHER): Payer: Self-pay

## 2023-12-25 ENCOUNTER — Ambulatory Visit (HOSPITAL_BASED_OUTPATIENT_CLINIC_OR_DEPARTMENT_OTHER)
Admission: RE | Admit: 2023-12-25 | Discharge: 2023-12-25 | Disposition: A | Discharge status: Home / Self Care | Source: Ambulatory Visit | Attending: Urgent Care | Admitting: Urgent Care

## 2023-12-25 DIAGNOSIS — R42 Dizziness and giddiness: Secondary | ICD-10-CM | POA: Diagnosis not present

## 2023-12-25 DIAGNOSIS — T63461A Toxic effect of venom of wasps, accidental (unintentional), initial encounter: Secondary | ICD-10-CM | POA: Diagnosis not present

## 2023-12-25 DIAGNOSIS — R55 Syncope and collapse: Secondary | ICD-10-CM | POA: Diagnosis not present

## 2023-12-25 DIAGNOSIS — T63451A Toxic effect of venom of hornets, accidental (unintentional), initial encounter: Secondary | ICD-10-CM | POA: Diagnosis not present

## 2023-12-25 MED ORDER — IOHEXOL 350 MG/ML SOLN
100.0000 mL | Freq: Once | INTRAVENOUS | Status: AC | PRN
  Administered 2023-12-25: 75 mL via INTRAVENOUS

## 2024-01-02 ENCOUNTER — Other Ambulatory Visit: Payer: Self-pay

## 2024-01-02 ENCOUNTER — Ambulatory Visit: Attending: Family Medicine | Admitting: Rehabilitative and Restorative Service Providers"

## 2024-01-02 ENCOUNTER — Encounter: Payer: Self-pay | Admitting: Rehabilitative and Restorative Service Providers"

## 2024-01-02 DIAGNOSIS — R42 Dizziness and giddiness: Secondary | ICD-10-CM | POA: Insufficient documentation

## 2024-01-02 DIAGNOSIS — R2681 Unsteadiness on feet: Secondary | ICD-10-CM | POA: Insufficient documentation

## 2024-01-02 DIAGNOSIS — H8113 Benign paroxysmal vertigo, bilateral: Secondary | ICD-10-CM | POA: Diagnosis not present

## 2024-01-02 NOTE — Therapy (Unsigned)
 OUTPATIENT PHYSICAL THERAPY VESTIBULAR EVALUATION   Patient Name: Matthew Marks MRN: [id number] DOB:1975-10-05, 48 y.o., male Today's Date: 01/03/2024  END OF SESSION:  PT End of Session - 01/02/24 1452     Visit Number 1    Number of Visits 8    Date for Recertification  03/02/24    Authorization Type BCBS    PT Start Time 1452    PT Stop Time 1530    PT Time Calculation (min) 38 min    Activity Tolerance Patient tolerated treatment well    Behavior During Therapy [facility] for tasks assessed/performed          Past Medical History:  Diagnosis Date   H/O chronic ear infection 07/04/2014   Past Surgical History:  Procedure Laterality Date   BACK SURGERY     left knee surgery ligament tension     TYMPANOSTOMY TUBE PLACEMENT     as a child and as an adult   Patient Active Problem List   Diagnosis Date Noted   Rib pain 07/28/2023   Elevated blood pressure reading 06/06/2023   S/P arthroscopy of left shoulder 06/30/2022   Dysfunction of left eustachian tube 03/25/2022   Degenerative superior labral anterior-to-posterior (SLAP) tear of left shoulder 03/10/2022   Erectile dysfunction 01/13/2022   Thrombosed external hemorrhoid 01/02/2020   Post-viral cough syndrome 10/10/2019   Insect sting 08/30/2019   Biceps tendinitis, right, distal 06/25/2019   Well adult exam 05/01/2019   Elevated fasting glucose 05/01/2017   Tinea pedis of both feet 06/07/2016   MDD (major depressive disorder), recurrent episode, moderate (HCC) 05/25/2016   Chronic alcohol use 05/25/2016   GAD (generalized anxiety disorder) 04/19/2016   Poor concentration 10/13/2015   Globus sensation 09/04/2014   DDD (degenerative disc disease), lumbar 07/10/2014   H/O chronic ear infection 07/04/2014   Family history of prostate cancer 07/04/2014   Bilateral plantar fasciitis 07/04/2014   Presence of tympanostomy tube in right tympanic membrane 07/04/2014    PCP: Velma Ku, DO REFERRING PROVIDER: Benton Gave, PA  REFERRING DIAG: R42 (ICD-10-CM) - Dizziness   THERAPY DIAG:  BPPV (benign paroxysmal positional vertigo), bilateral  Dizziness and giddiness  Unsteadiness on feet  ONSET DATE: 01/01/24  Rationale for Evaluation and Treatment: Rehabilitation  SUBJECTIVE:   SUBJECTIVE STATEMENT: The patient had sudden onset of spinning dizziness, rocking sensation, that began int he middle of the night while on a cruise 3 weeks ago. He used over the counter Bonine to manage symptoms acutely. His exam revealed fluid on his inner ear and he was prescribed prednisone , nasal spray, and is taking sudafed.  He has had intermittent tinnitus in the past, and is noticing this more today. He gets dizziness worse in the morning, looking up, or with quick head movements. Symptoms improve some throughout the day and sitting still improves dizziness.  Pt accompanied by: self  PERTINENT HISTORY: tympanostomy  PAIN:  Are you having pain? No  PRECAUTIONS: None  RED FLAGS: None   WEIGHT BEARING RESTRICTIONS: No  FALLS: Has patient fallen in last 6 months? No  LIVING ENVIRONMENT: Lives with: lives with their family Lives in: House/apartment  PLOF: Independent  PATIENT GOALS: reduce dizziness  OBJECTIVE:  Note: Objective measures were completed at Evaluation unless otherwise noted.  COGNITION: Overall cognitive status: Within functional limits for tasks assessed   SENSATION: WFL  Cervical ROM:   WFL notes tightness since onset of dizziness  GAIT: Comments: WNLs on solid surfaces  PATIENT SURVEYS:  Did  not perform due to low # of visits anticipated (HEP progression)  VESTIBULAR ASSESSMENT:  GENERAL OBSERVATION: Patient walks into clinic independently without a device   SYMPTOM BEHAVIOR:  Subjective history: sudden onset of dizziness while traveling  Non-Vestibular symptoms: tinnitus and head rushes, brain fog  Type of dizziness: Imbalance (Disequilibrium), Spinning/Vertigo, and  Unsteady with head/body turns  Frequency: daily  Duration: lasts for seconds to minutes  Aggravating factors: Induced by position change: sit to stand, Induced by motion: looking up at the ceiling, bending down to the ground, turning body quickly, and turning head quickly, and Worse in the morning  Relieving factors: head stationary  Progression of symptoms: better  OCULOMOTOR EXAM:  Ocular Alignment: normal  Ocular ROM: No Limitations  Spontaneous Nystagmus: absent  Gaze-Induced Nystagmus: absent  Smooth Pursuits: intact and patient feels a sensation of eyes are jittery  Saccades: intact   VESTIBULAR - OCULAR REFLEX:   Slow VOR: Comment: blurry visually and then gets dizzy when he stops (3-4/10)  VOR Cancellation: Normal  Head-Impulse Test: HIT Right: positive with refixation saccade followed by beats of nystagmus  HIT Left: negative  Dynamic Visual Acuity: TBA   POSITIONAL TESTING: Right Dix-Hallpike: upbeating, right nystagmus Left Dix-Hallpike: no nystagmus Right Roll Test: apogeotropic nystagmus Left Roll Test: apogeotropic nystagmus *Positional testing reveals R posterior canalithiasis and horizontal canal cupulolithiasis (feels like sxs worse on R side, which would indicate L horizontal cupulo, however R posterior canal BPPV may be contributing to subjective presentation)  MOTION SENSITIVITY:  Motion Sensitivity Quotient  TBA  Intensity: 0 = none, 1 = Lightheaded, 2 = Mild, 3 = Moderate, 4 = Severe, 5 = Vomiting  Intensity  1. Sitting to supine   2. Supine to L side   3. Supine to R side   4. Supine to sitting   5. L Hallpike-Dix   6. Up from L    7. R Hallpike-Dix   8. Up from R    9. Sitting, head tipped to L knee   10. Head up from L knee   11. Sitting, head tipped to R knee   12. Head up from R knee   13. Sitting head turns x5   14.Sitting head nods x5   15. In stance, 180 turn to L    16. In stance, 180 turn to R     OTHOSTATICS: not done   [facility]  Adult PT Treatment:                                                DATE: 01/02/24 Canalith Repositioning:  Epley Right: Number of Reps: 1, Response to Treatment: comment: tolerated well, and Comment: patient has a latency period with positional testing and with epley's --dizziness begans after 15-20 seconds Gaze Adaptation:  x1 Viewing Horizontal: Position: seated, Time: 30 seconds, and Reps: 2   PATIENT EDUCATION: Education details: PT plan of care, HEP, nature of condition Person educated: Patient Education method: Explanation, Demonstration, and Handouts Education comprehension: verbalized understanding, returned demonstration, and needs further education  HOME EXERCISE PROGRAM: Access Code: [email or web address] URL: [email or web address] Date: 01/02/2024 Prepared by: Tawni Ferrier  Exercises - Seated Gaze Stabilization with Head Rotation  - 2-3 x daily - 7 x weekly - 2 sets - 1 reps - 30 seconds hold  GOALS: Goals reviewed with patient? Yes  SHORT TERM GOALS: Target  date: 02/02/24  The patient will be indep with initial HEP Baseline: initiated at eval Goal status: INITIAL  2.  The patient will have negative positional testing revealing resolution of BPPV.  Baseline:  Positive testing  Goal status: INITIAL  3.  The patient will tolerate gaze x 30 seconds without increased baseline dizziness at self selected pace. Baseline:  dizziness increases Goal status: INITIAL  LONG TERM GOALS: Target date: 03/02/24  The patient will be indep with progression of HEP. Baseline:  initiated at eval Goal status: INITIAL  2.  The patient will improve gaze adaptation to 60 seconds without increased dizziness to demo improvement with turns.   Baseline: increased sxs and reports dizziness, unsteadiness with turns Goal status: INITIAL  3.  The patient will report return to prior status without c/o dizziness. Baseline:  dizziness limiting daily activities Goal status: INITIAL  4.   The patient will return to driving. Baseline:  Wife is currently assisting with driving tasks. Goal status: INITIAL  ASSESSMENT:  CLINICAL IMPRESSION: Patient is a 48 y.o. male who was seen today for physical therapy evaluation and treatment for acute onset of vertigo 3 weeks ago. During today's evaluation, he presents with + R head impulse testing indicating diminished VOR, positive R dix hallpike test indicating R posterior canalithiasis, and positive bilateral horizontal roll (sxs worse to R) indicating horizontal cupulolithiasis due to apogeotropic nystagmus. PT initiated treatment today for gaze adaptation and for R epley's maneuver. Plan to reassess and progress based on patient tolerance.    OBJECTIVE IMPAIRMENTS: decreased activity tolerance, decreased balance, and dizziness.   ACTIVITY LIMITATIONS: bending, bed mobility, and locomotion level  PARTICIPATION LIMITATIONS: driving, community activity, and occupation  PERSONAL FACTORS: 1 comorbidity: h/o chronic ear infections are also affecting patient's functional outcome.   REHAB POTENTIAL: Good  CLINICAL DECISION MAKING: Evolving/moderate complexity  EVALUATION COMPLEXITY: Moderate   PLAN:  PT FREQUENCY: 1-2x/week  PT DURATION: 8 weeks  PLANNED INTERVENTIONS: 97164- PT Re-evaluation, 97750- Physical Performance Testing, 97110-Therapeutic exercises, 97530- Therapeutic activity, [id number]- Neuromuscular re-education, 97535- Self Care, [id number]- Manual therapy, [id number]- Gait training, [phone]- Canalith repositioning, Patient/Family education, Vestibular training, and Visual/preceptual remediation/compensation  PLAN FOR NEXT SESSION: check positional testing, and treat remaining BPPV; progress gaze; assess multi-sensory balance and add habituation (turns) for home in standing if safe.  Anticipate need for gaze x 1 (add vertical), habituation head turns, 180 degree turns in standing near support, and tx for R posterior canal + horizontal  canal BPPV.    Dmario Russom, PT 01/03/2024, 9:29 AM

## 2024-01-09 ENCOUNTER — Encounter: Payer: Self-pay | Admitting: Physical Therapy

## 2024-01-09 ENCOUNTER — Ambulatory Visit: Attending: Family Medicine | Admitting: Physical Therapy

## 2024-01-09 DIAGNOSIS — R2681 Unsteadiness on feet: Secondary | ICD-10-CM | POA: Diagnosis not present

## 2024-01-09 DIAGNOSIS — R42 Dizziness and giddiness: Secondary | ICD-10-CM | POA: Insufficient documentation

## 2024-01-09 DIAGNOSIS — H8113 Benign paroxysmal vertigo, bilateral: Secondary | ICD-10-CM | POA: Diagnosis not present

## 2024-01-09 NOTE — Therapy (Signed)
 OUTPATIENT PHYSICAL THERAPY VESTIBULAR TREATMENT   Patient Name: Matthew Marks MRN: [id number] DOB:01/02/1976, 48 y.o., male Today's Date: 01/09/2024  END OF SESSION:  PT End of Session - 01/09/24 1705     Visit Number 2    Number of Visits 8    Date for Recertification  03/02/24    Authorization Type BCBS    PT Start Time 1615    PT Stop Time 1654    PT Time Calculation (min) 39 min    Activity Tolerance Patient tolerated treatment well    Behavior During Therapy [facility] for tasks assessed/performed           Past Medical History:  Diagnosis Date   H/O chronic ear infection 07/04/2014   Past Surgical History:  Procedure Laterality Date   BACK SURGERY     left knee surgery ligament tension     TYMPANOSTOMY TUBE PLACEMENT     as a child and as an adult   Patient Active Problem List   Diagnosis Date Noted   Rib pain 07/28/2023   Elevated blood pressure reading 06/06/2023   S/P arthroscopy of left shoulder 06/30/2022   Dysfunction of left eustachian tube 03/25/2022   Degenerative superior labral anterior-to-posterior (SLAP) tear of left shoulder 03/10/2022   Erectile dysfunction 01/13/2022   Thrombosed external hemorrhoid 01/02/2020   Post-viral cough syndrome 10/10/2019   Insect sting 08/30/2019   Biceps tendinitis, right, distal 06/25/2019   Well adult exam 05/01/2019   Elevated fasting glucose 05/01/2017   Tinea pedis of both feet 06/07/2016   MDD (major depressive disorder), recurrent episode, moderate (HCC) 05/25/2016   Chronic alcohol use 05/25/2016   GAD (generalized anxiety disorder) 04/19/2016   Poor concentration 10/13/2015   Globus sensation 09/04/2014   DDD (degenerative disc disease), lumbar 07/10/2014   H/O chronic ear infection 07/04/2014   Family history of prostate cancer 07/04/2014   Bilateral plantar fasciitis 07/04/2014   Presence of tympanostomy tube in right tympanic membrane 07/04/2014    PCP: Velma Ku, DO REFERRING PROVIDER: Benton Gave, PA  REFERRING DIAG: R42 (ICD-10-CM) - Dizziness   THERAPY DIAG:  BPPV (benign paroxysmal positional vertigo), bilateral  Dizziness and giddiness  Unsteadiness on feet  ONSET DATE: 01/01/24  Rationale for Evaluation and Treatment: Rehabilitation  SUBJECTIVE:   SUBJECTIVE STATEMENT: Pt states he is still dizzy when he gets up in the morning but it is not as bad. He still has dizziness with quick head movements. He has been performing VOR exercise every day.  PERTINENT HISTORY: tympanostomy  The patient had sudden onset of spinning dizziness, rocking sensation, that began int he middle of the night while on a cruise 3 weeks ago. He used over the counter Bonine to manage symptoms acutely. His exam revealed fluid on his inner ear and he was prescribed prednisone , nasal spray, and is taking sudafed.  He has had intermittent tinnitus in the past, and is noticing this more today. He gets dizziness worse in the morning, looking up, or with quick head movements. Symptoms improve some throughout the day and sitting still improves dizziness.  Pt accompanied by: self PAIN:  Are you having pain? No  PRECAUTIONS: None  RED FLAGS: None   WEIGHT BEARING RESTRICTIONS: No  FALLS: Has patient fallen in last 6 months? No  LIVING ENVIRONMENT: Lives with: lives with their family Lives in: House/apartment  PLOF: Independent  PATIENT GOALS: reduce dizziness  OBJECTIVE:  Note: Objective measures were completed at Evaluation unless otherwise noted.  COGNITION: Overall  cognitive status: Within functional limits for tasks assessed   SENSATION: WFL  Cervical ROM:   WFL notes tightness since onset of dizziness  GAIT: Comments: WNLs on solid surfaces  PATIENT SURVEYS:  Did not perform due to low # of visits anticipated (HEP progression)  VESTIBULAR ASSESSMENT:  GENERAL OBSERVATION: Patient walks into clinic independently without a device   SYMPTOM BEHAVIOR:  Subjective  history: sudden onset of dizziness while traveling  Non-Vestibular symptoms: tinnitus and head rushes, brain fog  Type of dizziness: Imbalance (Disequilibrium), Spinning/Vertigo, and Unsteady with head/body turns  Frequency: daily  Duration: lasts for seconds to minutes  Aggravating factors: Induced by position change: sit to stand, Induced by motion: looking up at the ceiling, bending down to the ground, turning body quickly, and turning head quickly, and Worse in the morning  Relieving factors: head stationary  Progression of symptoms: better  OCULOMOTOR EXAM:  Ocular Alignment: normal  Ocular ROM: No Limitations  Spontaneous Nystagmus: absent  Gaze-Induced Nystagmus: absent  Smooth Pursuits: intact and patient feels a sensation of eyes are jittery  Saccades: intact   VESTIBULAR - OCULAR REFLEX:   Slow VOR: Comment: blurry visually and then gets dizzy when he stops (3-4/10)  VOR Cancellation: Normal  Head-Impulse Test: HIT Right: positive with refixation saccade followed by beats of nystagmus  HIT Left: negative  Dynamic Visual Acuity: TBA   POSITIONAL TESTING: Right Dix-Hallpike: upbeating, right nystagmus Left Dix-Hallpike: no nystagmus Right Roll Test: apogeotropic nystagmus Left Roll Test: apogeotropic nystagmus *Positional testing reveals R posterior canalithiasis and horizontal canal cupulolithiasis (feels like sxs worse on R side, which would indicate L horizontal cupulo, however R posterior canal BPPV may be contributing to subjective presentation)  MOTION SENSITIVITY:  Motion Sensitivity Quotient  TBA  Intensity: 0 = none, 1 = Lightheaded, 2 = Mild, 3 = Moderate, 4 = Severe, 5 = Vomiting  Intensity  1. Sitting to supine   2. Supine to L side   3. Supine to R side   4. Supine to sitting   5. L Hallpike-Dix   6. Up from L    7. R Hallpike-Dix   8. Up from R    9. Sitting, head tipped to L knee   10. Head up from L knee   11. Sitting, head tipped to R knee   12.  Head up from R knee   13. Sitting head turns x5   14.Sitting head nods x5   15. In stance, 180 turn to L    16. In stance, 180 turn to R     OTHOSTATICS: not done   [facility] Adult PT Treatment:                                                DATE: 01/09/24  Neuromuscular re-ed: Epley Right 1 rep - dizziness but no nystagmus, resolves after 15-20 seconds Horizontal canal testing - negative bilat Seated VOR x 1 horizontal - dizziness 2-3/10 Standing VOR x 1 horizontal - dizziness 4/10 Standing VOR x 1 vertical - dizziness 4/10 Seated VOR x 1 vertical - dizziness 4/10 Therapeutic Activity: Mild dizziness noted with position changes supine <> sit throughout treatment Head turns seated  - able to tolerate x 25 seconds with dizziness 6/10 Quarter turns with visual spotting - decreased dizziness   [facility] Adult PT Treatment:  DATE: 01/02/24 Canalith Repositioning:  Epley Right: Number of Reps: 1, Response to Treatment: comment: tolerated well, and Comment: patient has a latency period with positional testing and with epley's --dizziness begans after 15-20 seconds Gaze Adaptation:  x1 Viewing Horizontal: Position: seated, Time: 30 seconds, and Reps: 2   PATIENT EDUCATION: Education details: PT plan of care, HEP, nature of condition Person educated: Patient Education method: Explanation, Demonstration, and Handouts Education comprehension: verbalized understanding, returned demonstration, and needs further education  HOME EXERCISE PROGRAM: Access Code: [email or web address] URL: [email or web address] Date: 01/09/2024 Prepared by: Darice Conine  Exercises - Seated Gaze Stabilization with Head Rotation  - 2-3 x daily - 7 x weekly - 2 sets - 1 reps - 30 seconds hold - Seated Gaze Stabilization with Head Nod  - 2-3 x daily - 7 x weekly - 2 sets - 1 reps - 30 seconds hold - Standing Quarter Turn with Counter Support  - 1 x daily - 7 x weekly -  1 sets - 10 reps  GOALS: Goals reviewed with patient? Yes  SHORT TERM GOALS: Target date: 02/02/24  The patient will be indep with initial HEP Baseline: initiated at eval Goal status: INITIAL  2.  The patient will have negative positional testing revealing resolution of BPPV.  Baseline:  Positive testing  Goal status: INITIAL  3.  The patient will tolerate gaze x 30 seconds without increased baseline dizziness at self selected pace. Baseline:  dizziness increases Goal status: INITIAL  LONG TERM GOALS: Target date: 03/02/24  The patient will be indep with progression of HEP. Baseline:  initiated at eval Goal status: INITIAL  2.  The patient will improve gaze adaptation to 60 seconds without increased dizziness to demo improvement with turns.   Baseline: increased sxs and reports dizziness, unsteadiness with turns Goal status: INITIAL  3.  The patient will report return to prior status without c/o dizziness. Baseline:  dizziness limiting daily activities Goal status: INITIAL  4.  The patient will return to driving. Baseline:  Wife is currently assisting with driving tasks. Goal status: INITIAL  ASSESSMENT:  CLINICAL IMPRESSION: Pt with positive Rt dix hallpike, negative horizontal canal testing. He has difficulty with vertical VOR x 1 and head turns without gaze stabilization. Added quarter turns with spotting to work on using visual system to decrease reliance on vestibular system during turns. Pt with good tolerance to quarter turns with spotting. Added vertical VOR x 1 to HEP  OBJECTIVE IMPAIRMENTS: decreased activity tolerance, decreased balance, and dizziness.     PLAN:  PT FREQUENCY: 1-2x/week  PT DURATION: 8 weeks  PLANNED INTERVENTIONS: 97164- PT Re-evaluation, 97750- Physical Performance Testing, 97110-Therapeutic exercises, 97530- Therapeutic activity, [id number]- Neuromuscular re-education, 97535- Self Care, [id number]- Manual therapy, 97116- Gait training, [phone]-  Canalith repositioning, Patient/Family education, Vestibular training, and Visual/preceptual remediation/compensation  PLAN FOR NEXT SESSION: check positional testing, and treat remaining BPPV; progress gaze; assess multi-sensory balance and add habituation (turns) for home in standing if safe.  Anticipate need for gaze x 1 (add vertical), habituation head turns, 180 degree turns in standing near support, and tx for R posterior canal + horizontal canal BPPV.    Karrie Fluellen, PT 01/09/2024, 5:06 PM

## 2024-01-18 DIAGNOSIS — T63461D Toxic effect of venom of wasps, accidental (unintentional), subsequent encounter: Secondary | ICD-10-CM | POA: Diagnosis not present

## 2024-01-18 DIAGNOSIS — T63451D Toxic effect of venom of hornets, accidental (unintentional), subsequent encounter: Secondary | ICD-10-CM | POA: Diagnosis not present

## 2024-01-24 ENCOUNTER — Ambulatory Visit: Payer: Self-pay | Admitting: Physical Therapy

## 2024-01-30 ENCOUNTER — Ambulatory Visit: Payer: Self-pay | Admitting: Physical Therapy

## 2024-02-05 DIAGNOSIS — T63451A Toxic effect of venom of hornets, accidental (unintentional), initial encounter: Secondary | ICD-10-CM | POA: Diagnosis not present

## 2024-02-05 DIAGNOSIS — T63461A Toxic effect of venom of wasps, accidental (unintentional), initial encounter: Secondary | ICD-10-CM | POA: Diagnosis not present

## 2024-02-13 DIAGNOSIS — T63461A Toxic effect of venom of wasps, accidental (unintentional), initial encounter: Secondary | ICD-10-CM | POA: Diagnosis not present

## 2024-02-13 DIAGNOSIS — T63451A Toxic effect of venom of hornets, accidental (unintentional), initial encounter: Secondary | ICD-10-CM | POA: Diagnosis not present
